# Patient Record
Sex: Female | Born: 1970
Health system: Southern US, Community
[De-identification: ages and names within clinical notes are randomized; demographics above are authoritative.]

## PROBLEM LIST (undated history)

## (undated) DIAGNOSIS — I10 Essential (primary) hypertension: Secondary | ICD-10-CM

## (undated) HISTORY — DX: Essential (primary) hypertension: I10

---

## 1993-10-06 HISTORY — PX: KNEE SURGERY: SHX244

## 1998-09-01 ENCOUNTER — Other Ambulatory Visit: Admission: RE | Admit: 1998-09-01 | Discharge: 1998-09-01 | Payer: Self-pay | Admitting: Plastic Surgery

## 2016-05-08 HISTORY — PX: ABLATION: SHX5711

## 2016-10-31 DIAGNOSIS — Z6831 Body mass index (BMI) 31.0-31.9, adult: Secondary | ICD-10-CM | POA: Diagnosis not present

## 2016-10-31 DIAGNOSIS — Z1389 Encounter for screening for other disorder: Secondary | ICD-10-CM | POA: Diagnosis not present

## 2016-10-31 DIAGNOSIS — Z0000 Encounter for general adult medical examination without abnormal findings: Secondary | ICD-10-CM | POA: Diagnosis not present

## 2016-11-02 DIAGNOSIS — N924 Excessive bleeding in the premenopausal period: Secondary | ICD-10-CM | POA: Diagnosis not present

## 2016-11-02 DIAGNOSIS — Z6832 Body mass index (BMI) 32.0-32.9, adult: Secondary | ICD-10-CM | POA: Diagnosis not present

## 2016-11-02 DIAGNOSIS — Z01419 Encounter for gynecological examination (general) (routine) without abnormal findings: Secondary | ICD-10-CM | POA: Diagnosis not present

## 2016-11-02 DIAGNOSIS — Z1231 Encounter for screening mammogram for malignant neoplasm of breast: Secondary | ICD-10-CM | POA: Diagnosis not present

## 2016-11-02 DIAGNOSIS — N920 Excessive and frequent menstruation with regular cycle: Secondary | ICD-10-CM | POA: Diagnosis not present

## 2016-11-20 MED FILL — TRANEXAMIC ACID 650 MG TAB: 650 | 5 days supply | Qty: 30 | Fill #0

## 2017-05-22 ENCOUNTER — Telehealth: Payer: 59 | Admitting: Family

## 2017-05-22 DIAGNOSIS — R059 Cough, unspecified: Secondary | ICD-10-CM

## 2017-05-22 DIAGNOSIS — R05 Cough: Secondary | ICD-10-CM

## 2017-05-22 MED ORDER — PREDNISONE 10 MG (21) PO TBPK: ORAL_TABLET | ORAL | 0 refills | Status: DC

## 2017-05-22 MED ORDER — AZITHROMYCIN 250 MG PO TABS: ORAL_TABLET | ORAL | 0 refills | Status: DC

## 2017-05-22 NOTE — Progress Notes (Signed)
We are sorry that you are not feeling well.  Here is how we plan to help!  Based on your presentation I believe you most likely have A cough due to bacteria.  When patients have a fever and a productive cough with a change in color or increased sputum production, we are concerned about bacterial bronchitis.  If left untreated it can progress to pneumonia.  If your symptoms do not improve with your treatment plan it is important that you contact your provider.   I have prescribed Azithromyin 250 mg: two tablets now and then one tablet daily for 4 additonal days    In addition you may use A non-prescription cough medication called Robitussin DAC. Take 2 teaspoons every 8 hours or Delsym: take 2 teaspoons every 12 hours.  Sterapred 10 mg dosepak  From your responses in the eVisit questionnaire you describe inflammation in the upper respiratory tract which is causing a significant cough.  This is commonly called Bronchitis and has four common causes:    Allergies  Viral Infections  Acid Reflux  Bacterial Infection Allergies, viruses and acid reflux are treated by controlling symptoms or eliminating the cause. An example might be a cough caused by taking certain blood pressure medications. You stop the cough by changing the medication. Another example might be a cough caused by acid reflux. Controlling the reflux helps control the cough.  USE OF BRONCHODILATOR ("RESCUE") INHALERS: There is a risk from using your bronchodilator too frequently.  The risk is that over-reliance on a medication which only relaxes the muscles surrounding the breathing tubes can reduce the effectiveness of medications prescribed to reduce swelling and congestion of the tubes themselves.  Although you feel brief relief from the bronchodilator inhaler, your asthma may actually be worsening with the tubes becoming more swollen and filled with mucus.  This can delay other crucial treatments, such as oral steroid medications. If  you need to use a bronchodilator inhaler daily, several times per day, you should discuss this with your provider.  There are probably better treatments that could be used to keep your asthma under control.     HOME CARE . Only take medications as instructed by your medical team. . Complete the entire course of an antibiotic. . Drink plenty of fluids and get plenty of rest. . Avoid close contacts especially the very young and the elderly . Cover your mouth if you cough or cough into your sleeve. . Always remember to wash your hands . A steam or ultrasonic humidifier can help congestion.   GET HELP RIGHT AWAY IF: . You develop worsening fever. . You become short of breath . You cough up blood. . Your symptoms persist after you have completed your treatment plan MAKE SURE YOU   Understand these instructions.  Will watch your condition.  Will get help right away if you are not doing well or get worse.  Your e-visit answers were reviewed by a board certified advanced clinical practitioner to complete your personal care plan.  Depending on the condition, your plan could have included both over the counter or prescription medications. If there is a problem please reply  once you have received a response from your provider. Your safety is important to [place].  If you have drug allergies check your prescription carefully.    You can use MyChart to ask questions about today's visit, request a non-urgent call back, or ask for a work or school excuse for 24 hours related to this e-Visit.  If it has been greater than 24 hours you will need to follow up with your provider, or enter a new e-Visit to address those concerns. You will get an e-mail in the next two days asking about your experience.  I hope that your e-visit has been valuable and will speed your recovery. Thank you for using e-visits.

## 2017-10-02 DIAGNOSIS — H52223 Regular astigmatism, bilateral: Secondary | ICD-10-CM | POA: Diagnosis not present

## 2017-10-02 DIAGNOSIS — H524 Presbyopia: Secondary | ICD-10-CM | POA: Diagnosis not present

## 2017-10-02 DIAGNOSIS — H5203 Hypermetropia, bilateral: Secondary | ICD-10-CM | POA: Diagnosis not present

## 2017-11-13 DIAGNOSIS — Z01419 Encounter for gynecological examination (general) (routine) without abnormal findings: Secondary | ICD-10-CM | POA: Diagnosis not present

## 2017-11-13 DIAGNOSIS — Z1231 Encounter for screening mammogram for malignant neoplasm of breast: Secondary | ICD-10-CM | POA: Diagnosis not present

## 2017-11-13 DIAGNOSIS — Z6833 Body mass index (BMI) 33.0-33.9, adult: Secondary | ICD-10-CM | POA: Diagnosis not present

## 2017-12-10 DIAGNOSIS — N921 Excessive and frequent menstruation with irregular cycle: Secondary | ICD-10-CM | POA: Diagnosis not present

## 2017-12-10 DIAGNOSIS — D252 Subserosal leiomyoma of uterus: Secondary | ICD-10-CM | POA: Diagnosis not present

## 2017-12-10 DIAGNOSIS — D251 Intramural leiomyoma of uterus: Secondary | ICD-10-CM | POA: Diagnosis not present

## 2017-12-13 MED FILL — BLISOVI 24 FE TABLET: 1-20 | 28 days supply | Qty: 28 | Fill #0

## 2017-12-24 DIAGNOSIS — N920 Excessive and frequent menstruation with regular cycle: Secondary | ICD-10-CM | POA: Diagnosis not present

## 2017-12-24 MED FILL — OXYCODONE-ACETAMINOPHEN 5-3: 5-325 | 5 days supply | Qty: 15 | Fill #0

## 2017-12-24 MED FILL — miSOPROStol 200 MCG TABS: 200 | 1 days supply | Qty: 1 | Fill #0

## 2017-12-24 MED FILL — IBUPROFEN 800 MG TAB: 800 | 7 days supply | Qty: 20 | Fill #0

## 2017-12-27 DIAGNOSIS — N926 Irregular menstruation, unspecified: Secondary | ICD-10-CM | POA: Diagnosis not present

## 2017-12-27 DIAGNOSIS — Z3202 Encounter for pregnancy test, result negative: Secondary | ICD-10-CM | POA: Diagnosis not present

## 2018-02-18 DIAGNOSIS — D225 Melanocytic nevi of trunk: Secondary | ICD-10-CM | POA: Diagnosis not present

## 2018-02-18 DIAGNOSIS — Z23 Encounter for immunization: Secondary | ICD-10-CM | POA: Diagnosis not present

## 2018-02-18 DIAGNOSIS — L814 Other melanin hyperpigmentation: Secondary | ICD-10-CM | POA: Diagnosis not present

## 2018-02-18 DIAGNOSIS — Z808 Family history of malignant neoplasm of other organs or systems: Secondary | ICD-10-CM | POA: Diagnosis not present

## 2018-02-18 DIAGNOSIS — Z86018 Personal history of other benign neoplasm: Secondary | ICD-10-CM | POA: Diagnosis not present

## 2019-04-16 DIAGNOSIS — Z1231 Encounter for screening mammogram for malignant neoplasm of breast: Secondary | ICD-10-CM | POA: Diagnosis not present

## 2019-04-16 DIAGNOSIS — Z01419 Encounter for gynecological examination (general) (routine) without abnormal findings: Secondary | ICD-10-CM | POA: Diagnosis not present

## 2019-04-16 DIAGNOSIS — Z6834 Body mass index (BMI) 34.0-34.9, adult: Secondary | ICD-10-CM | POA: Diagnosis not present

## 2019-04-30 DIAGNOSIS — Z6833 Body mass index (BMI) 33.0-33.9, adult: Secondary | ICD-10-CM | POA: Diagnosis not present

## 2019-04-30 DIAGNOSIS — I10 Essential (primary) hypertension: Secondary | ICD-10-CM | POA: Diagnosis not present

## 2019-05-01 MED FILL — traZODone HCL 50 MG TABS: 50 | 30 days supply | Qty: 30 | Fill #0

## 2019-05-26 MED FILL — LOSARTAN POTASSIUM 50 MG TA: 50 | 30 days supply | Qty: 30 | Fill #0

## 2019-05-26 MED FILL — traZODone HCL 50 MG TABS: 50 | 30 days supply | Qty: 30 | Fill #1

## 2019-05-30 DIAGNOSIS — I10 Essential (primary) hypertension: Secondary | ICD-10-CM | POA: Diagnosis not present

## 2019-05-30 DIAGNOSIS — Z7689 Persons encountering health services in other specified circumstances: Secondary | ICD-10-CM | POA: Diagnosis not present

## 2019-05-30 DIAGNOSIS — R079 Chest pain, unspecified: Secondary | ICD-10-CM | POA: Diagnosis not present

## 2019-06-02 DIAGNOSIS — R079 Chest pain, unspecified: Secondary | ICD-10-CM | POA: Diagnosis not present

## 2019-06-02 DIAGNOSIS — I10 Essential (primary) hypertension: Secondary | ICD-10-CM | POA: Diagnosis not present

## 2019-06-02 DIAGNOSIS — Z6832 Body mass index (BMI) 32.0-32.9, adult: Secondary | ICD-10-CM | POA: Diagnosis not present

## 2019-06-02 MED FILL — traZODone HCL 100 MG TABS: 100 | 30 days supply | Qty: 30 | Fill #0

## 2019-06-24 MED FILL — LOSARTAN POTASSIUM 50 MG TA: 50 | 30 days supply | Qty: 30 | Fill #1

## 2019-06-26 ENCOUNTER — Ambulatory Visit: Payer: 59 | Admitting: Cardiovascular Disease

## 2019-06-26 MED FILL — traZODone HCL 100 MG TABS: 100 | 30 days supply | Qty: 30 | Fill #1

## 2019-07-17 ENCOUNTER — Ambulatory Visit (INDEPENDENT_AMBULATORY_CARE_PROVIDER_SITE_OTHER): Payer: 59 | Admitting: Cardiovascular Disease

## 2019-07-17 ENCOUNTER — Encounter: Payer: Self-pay | Admitting: Cardiovascular Disease

## 2019-07-17 ENCOUNTER — Other Ambulatory Visit: Payer: Self-pay

## 2019-07-17 VITALS — BP 110/76 | HR 80 | Temp 97.0°F | Ht 67.0 in | Wt 209.0 lb

## 2019-07-17 DIAGNOSIS — R079 Chest pain, unspecified: Secondary | ICD-10-CM | POA: Diagnosis not present

## 2019-07-17 DIAGNOSIS — I10 Essential (primary) hypertension: Secondary | ICD-10-CM | POA: Diagnosis not present

## 2019-07-17 NOTE — Progress Notes (Signed)
CARDIOLOGY CONSULT NOTE  Patient ID: Karen Parker MRN: [id number] DOB/AGE: January 25, 1971 49 y.o.  Admit date: (Not on file) Primary Physician: Lanelle Bal PA-C  Reason for Consultation: Chest pain  HPI: Karen Parker is a 49 y.o. female who is being seen today for the evaluation of chest pain at the request of Lanelle Bal PA-C.  Her son, Jeneen Rinks, is also my patient.  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  I reviewed records from her PCP.  Labs on 05/30/2019 showed total cholesterol 161, triglycerides 187, HDL 45, LDL 84.  Past medical history includes hypertension.  An ECG was reportedly performed at her PCPs office which I will have to request.  She had been having blood pressures as high as 182/87.  She was then started on losartan.  I reviewed her blood pressure log.  She has not been having any chest pains over the past month.  She denies shortness of breath, palpitations, dizziness, leg swelling, orthopnea, and paroxysmal nocturnal dyspnea.  Family history: Father has a history of aortic dissection.   No Known Allergies  Current Outpatient Medications  Medication Sig Dispense Refill  . cetirizine (ZYRTEC) 10 MG tablet Take 10 mg by mouth daily.    . Cholecalciferol (VITAMIN D3) 50 MCG (2000 UT) capsule Take 2,000 Units by mouth daily.    . Melatonin 200 MCG TABS Take 1 tablet by mouth daily.    Marland Kitchen omeprazole (PRILOSEC) 20 MG capsule Take 20 mg by mouth daily.    Marland Kitchen zinc gluconate 50 MG tablet Take 50 mg by mouth daily.    Marland Kitchen losartan (COZAAR) 50 MG tablet Take 50 mg by mouth daily.    . traZODone (DESYREL) 100 MG tablet Take 100 mg by mouth at bedtime.     No current facility-administered medications for this visit.    Past Medical History:  Diagnosis Date  . Hypertension     Past Surgical History:  Procedure Laterality Date  . KNEE SURGERY Right 10/1993    Social History   Socioeconomic History  . Marital  status: Married    Spouse name: Not on file  . Number of children: Not on file  . Years of education: Not on file  . Highest education level: Not on file  Occupational History  . Not on file  Tobacco Use  . Smoking status: Never Smoker  . Smokeless tobacco: Never Used  Substance and Sexual Activity  . Alcohol use: Not on file  . Drug use: Not on file  . Sexual activity: Not on file  Other Topics Concern  . Not on file  Social History Narrative  . Not on file   Social Determinants of Health   Financial Resource Strain:   . Difficulty of Paying Living Expenses:   Food Insecurity:   . Worried About Charity fundraiser in the Last Year:   . Arboriculturist in the Last Year:   Transportation Needs:   . Film/video editor (Medical):   Marland Kitchen Lack of Transportation (Non-Medical):   Physical Activity:   . Days of Exercise per Week:   . Minutes of Exercise per Session:   Stress:   . Feeling of Stress :   Social Connections:   . Frequency of Communication with Friends and Family:   . Frequency of Social Gatherings with Friends and Family:   . Attends Religious Services:   . Active Member of Clubs or Organizations:   .  Attends Archivist Meetings:   Marland Kitchen Marital Status:   Intimate Partner Violence:   . Fear of Current or Ex-Partner:   . Emotionally Abused:   Marland Kitchen Physically Abused:   . Sexually Abused:       Current Meds  Medication Sig  . cetirizine (ZYRTEC) 10 MG tablet Take 10 mg by mouth daily.  . Cholecalciferol (VITAMIN D3) 50 MCG (2000 UT) capsule Take 2,000 Units by mouth daily.  . Melatonin 200 MCG TABS Take 1 tablet by mouth daily.  Marland Kitchen omeprazole (PRILOSEC) 20 MG capsule Take 20 mg by mouth daily.  Marland Kitchen zinc gluconate 50 MG tablet Take 50 mg by mouth daily.      Review of systems complete and found to be negative unless listed above in HPI   Barbarann Ehlers, RN was present throughout the entirety of the encounter.  Physical exam Blood pressure 110/76,  pulse 80, temperature (!) 97 F (36.1 C), height 5\' 7"  (1.702 m), SpO2 98 %. General: NAD Neck: No JVD, no thyromegaly or thyroid nodule.  Lungs: Clear to auscultation bilaterally with normal respiratory effort. CV: Nondisplaced PMI. Regular rate and rhythm, normal S1/S2, no S3/S4, no murmur.  No peripheral edema.  No carotid bruit.    Abdomen: Soft, nontender, no distention.  Skin: Intact without lesions or rashes.  Neurologic: Alert and oriented x 3.  Psych: Normal affect. Extremities: No clubbing or cyanosis.  HEENT: Normal.   ECG: Most recent ECG reviewed.   Labs: No results found for: K, BUN, CREATININE, ALT, TSH, HGB   Lipids: No results found for: LDLCALC, LDLDIRECT, CHOL, TRIG, HDL      ASSESSMENT AND PLAN:   1.  Chest pain: No symptom recurrence.  It was likely due to severely elevated blood pressures.  Hypertension is under good control now.  2.  Hypertension: Blood pressure is normal.  No change to therapy.    Disposition: Follow up as needed  Signed: Kate Sable, M.D., F.A.C.C.  07/17/2019, 2:49 PM

## 2019-07-17 NOTE — Patient Instructions (Signed)
Medication Instructions:  Your physician recommends that you continue on your current medications as directed. Please refer to the Current Medication list given to you today.  *If you need a refill on your cardiac medications before your next appointment, please call your pharmacy*   Lab Work: None  If you have labs (blood work) drawn today and your tests are completely normal, you will receive your results only by: Marland Kitchen MyChart Message (if you have MyChart) OR . A paper copy in the mail If you have any lab test that is abnormal or we need to change your treatment, we will call you to review the results.   Testing/Procedures: None    Follow-Up: At [facility], you and your health needs are our priority.  As part of our continuing mission to provide you with exceptional heart care, we have created designated Provider Care Teams.  These Care Teams include your primary Cardiologist (physician) and Advanced Practice Providers (APPs -  Physician Assistants and Nurse Practitioners) who all work together to provide you with the care you need, when you need it.  We recommend signing up for the patient portal called "MyChart".  Sign up information is provided on this After Visit Summary.  MyChart is used to connect with patients for Virtual Visits (Telemedicine).  Patients are able to view lab/test results, encounter notes, upcoming appointments, etc.  Non-urgent messages can be sent to your provider as well.   To learn more about what you can do with MyChart, go to [email or web address].    Your next appointment:  As needed with Dr.Koneswaran      Thank you for choosing [facility] !

## 2019-07-28 MED FILL — LOSARTAN POTASSIUM 50 MG TA: 50 | 30 days supply | Qty: 30 | Fill #0

## 2019-07-28 MED FILL — traZODone HCL 100 MG TABS: 100 | 30 days supply | Qty: 30 | Fill #2

## 2019-08-26 MED FILL — LOSARTAN POTASSIUM 50 MG TA: 50 | 30 days supply | Qty: 30 | Fill #1

## 2019-08-26 MED FILL — traZODone HCL 100 MG TABS: 100 | 30 days supply | Qty: 30 | Fill #3

## 2019-09-02 ENCOUNTER — Other Ambulatory Visit (HOSPITAL_COMMUNITY): Payer: Self-pay | Admitting: Family Medicine

## 2019-09-02 DIAGNOSIS — G4700 Insomnia, unspecified: Secondary | ICD-10-CM | POA: Diagnosis not present

## 2019-09-02 DIAGNOSIS — Z6831 Body mass index (BMI) 31.0-31.9, adult: Secondary | ICD-10-CM | POA: Diagnosis not present

## 2019-09-02 DIAGNOSIS — I10 Essential (primary) hypertension: Secondary | ICD-10-CM | POA: Diagnosis not present

## 2019-09-24 MED FILL — LOSARTAN POTASSIUM 50 MG TA: 50 | 30 days supply | Qty: 30 | Fill #0

## 2019-09-24 MED FILL — traZODone HCL 100 MG TABS: 100 | 30 days supply | Qty: 30 | Fill #0

## 2019-10-16 DIAGNOSIS — L570 Actinic keratosis: Secondary | ICD-10-CM | POA: Diagnosis not present

## 2019-10-16 DIAGNOSIS — I781 Nevus, non-neoplastic: Secondary | ICD-10-CM | POA: Diagnosis not present

## 2019-10-16 DIAGNOSIS — L814 Other melanin hyperpigmentation: Secondary | ICD-10-CM | POA: Diagnosis not present

## 2019-10-27 MED FILL — LOSARTAN POTASSIUM 50 MG TA: 50 | 30 days supply | Qty: 30 | Fill #1

## 2019-10-27 MED FILL — traZODone HCL 100 MG TABS: 100 | 30 days supply | Qty: 30 | Fill #1

## 2019-11-22 MED FILL — traZODone HCL 100 MG TABS: 100 | 30 days supply | Qty: 30 | Fill #2

## 2019-11-22 MED FILL — LOSARTAN POTASSIUM 50 MG TA: 50 | 30 days supply | Qty: 30 | Fill #2

## 2019-12-22 MED FILL — LOSARTAN POTASSIUM 50 MG TA: 50 | 30 days supply | Qty: 30 | Fill #3

## 2019-12-22 MED FILL — traZODone HCL 100 MG TABS: 100 | 30 days supply | Qty: 30 | Fill #3

## 2020-01-21 MED FILL — LOSARTAN POTASSIUM 50 MG TA: 50 | 30 days supply | Qty: 30 | Fill #4

## 2020-01-21 MED FILL — traZODone HCL 100 MG TABS: 100 | 30 days supply | Qty: 30 | Fill #4

## 2020-02-09 DIAGNOSIS — D1801 Hemangioma of skin and subcutaneous tissue: Secondary | ICD-10-CM | POA: Diagnosis not present

## 2020-02-09 DIAGNOSIS — D2272 Melanocytic nevi of left lower limb, including hip: Secondary | ICD-10-CM | POA: Diagnosis not present

## 2020-02-09 DIAGNOSIS — I781 Nevus, non-neoplastic: Secondary | ICD-10-CM | POA: Diagnosis not present

## 2020-02-09 DIAGNOSIS — L814 Other melanin hyperpigmentation: Secondary | ICD-10-CM | POA: Diagnosis not present

## 2020-02-09 DIAGNOSIS — L570 Actinic keratosis: Secondary | ICD-10-CM | POA: Diagnosis not present

## 2020-02-09 DIAGNOSIS — D485 Neoplasm of uncertain behavior of skin: Secondary | ICD-10-CM | POA: Diagnosis not present

## 2020-02-17 MED FILL — LOSARTAN POTASSIUM 50 MG TA: 50 | 30 days supply | Qty: 30 | Fill #5

## 2020-02-17 MED FILL — traZODone HCL 100 MG TABS: 100 | 30 days supply | Qty: 30 | Fill #5

## 2020-03-04 ENCOUNTER — Other Ambulatory Visit (HOSPITAL_COMMUNITY): Payer: Self-pay | Admitting: Family Medicine

## 2020-03-04 DIAGNOSIS — G4700 Insomnia, unspecified: Secondary | ICD-10-CM | POA: Diagnosis not present

## 2020-03-04 DIAGNOSIS — Z6831 Body mass index (BMI) 31.0-31.9, adult: Secondary | ICD-10-CM | POA: Diagnosis not present

## 2020-03-04 DIAGNOSIS — I10 Essential (primary) hypertension: Secondary | ICD-10-CM | POA: Diagnosis not present

## 2020-03-18 MED FILL — traZODone HCL 100 MG TABS: 100 | 30 days supply | Qty: 30 | Fill #6

## 2020-03-18 MED FILL — LOSARTAN POTASSIUM 50 MG TA: 50 | 30 days supply | Qty: 30 | Fill #6

## 2020-04-19 MED FILL — traZODone HCL 100 MG TABS: 100 | 90 days supply | Qty: 90 | Fill #0

## 2020-04-19 MED FILL — LOSARTAN POTASSIUM 50 MG TA: 50 | 90 days supply | Qty: 90 | Fill #0

## 2020-05-06 DIAGNOSIS — U071 COVID-19: Secondary | ICD-10-CM | POA: Diagnosis not present

## 2020-06-15 DIAGNOSIS — Z01419 Encounter for gynecological examination (general) (routine) without abnormal findings: Secondary | ICD-10-CM | POA: Diagnosis not present

## 2020-06-15 DIAGNOSIS — Z6834 Body mass index (BMI) 34.0-34.9, adult: Secondary | ICD-10-CM | POA: Diagnosis not present

## 2020-06-15 DIAGNOSIS — Z1231 Encounter for screening mammogram for malignant neoplasm of breast: Secondary | ICD-10-CM | POA: Diagnosis not present

## 2020-07-05 ENCOUNTER — Telehealth: Payer: 59 | Admitting: Emergency Medicine

## 2020-07-05 DIAGNOSIS — J019 Acute sinusitis, unspecified: Secondary | ICD-10-CM | POA: Diagnosis not present

## 2020-07-05 DIAGNOSIS — B9689 Other specified bacterial agents as the cause of diseases classified elsewhere: Secondary | ICD-10-CM

## 2020-07-05 MED ORDER — PREDNISONE 50 MG PO TABS: 50.0000 mg | ORAL_TABLET | Freq: Every day | ORAL | 0 refills | Status: AC

## 2020-07-05 MED ORDER — AMOXICILLIN-POT CLAVULANATE 875-125 MG PO TABS: 1.0000 | ORAL_TABLET | Freq: Two times a day (BID) | ORAL | 0 refills | Status: DC

## 2020-07-05 NOTE — Progress Notes (Signed)
We are sorry that you are not feeling well.  Here is how we plan to help!  Based on what you have shared with me it looks like you have sinusitis.  Sinusitis is inflammation and infection in the sinus cavities of the head.  Based on your presentation I believe you most likely have Acute Bacterial Sinusitis.  This is an infection caused by bacteria and is treated with antibiotics. I have prescribed Augmentin 875mg /125mg  one tablet twice daily with food, for 7 days. You are also being prescribed prednisone 50mg  once daily with food for 5 days to help with pain and inflammation in your sinuses. You may use an oral decongestant such as Mucinex D or if you have glaucoma or high blood pressure use plain Mucinex. Saline nasal spray help and can safely be used as often as needed for congestion.  If you develop worsening sinus pain, fever or notice severe headache and vision changes, or if symptoms are not better after completion of antibiotic, please schedule an appointment with a health care provider.    Sinus infections are not as easily transmitted as other respiratory infection, however we still recommend that you avoid close contact with loved ones, especially the very young and elderly.  Remember to wash your hands thoroughly throughout the day as this is the number one way to prevent the spread of infection!  Home Care:  Only take medications as instructed by your medical team.  Complete the entire course of an antibiotic.  Do not take these medications with alcohol.  A steam or ultrasonic humidifier can help congestion.  You can place a towel over your head and breathe in the steam from hot water coming from a faucet.  Avoid close contacts especially the very young and the elderly.  Cover your mouth when you cough or sneeze.  Always remember to wash your hands.  Get Help Right Away If:  You develop worsening fever or sinus pain.  You develop a severe head ache or visual changes.  Your  symptoms persist after you have completed your treatment plan.  Make sure you  Understand these instructions.  Will watch your condition.  Will get help right away if you are not doing well or get worse.  Your e-visit answers were reviewed by a board certified advanced clinical practitioner to complete your personal care plan.  Depending on the condition, your plan could have included both over the counter or prescription medications.  If there is a problem please reply  once you have received a response from your provider.  Your safety is important to [place].  If you have drug allergies check your prescription carefully.    You can use MyChart to ask questions about today's visit, request a non-urgent call back, or ask for a work or school excuse for 24 hours related to this e-Visit. If it has been greater than 24 hours you will need to follow up with your provider, or enter a new e-Visit to address those concerns.  You will get an e-mail in the next two days asking about your experience.  I hope that your e-visit has been valuable and will speed your recovery. Thank you for using e-visits.    Approximately 5 minutes was spent documenting and reviewing patient's chart.

## 2020-08-07 ENCOUNTER — Other Ambulatory Visit (HOSPITAL_COMMUNITY): Payer: Self-pay

## 2020-08-16 ENCOUNTER — Other Ambulatory Visit (HOSPITAL_COMMUNITY): Payer: Self-pay

## 2020-08-16 MED FILL — Losartan Potassium Tab 50 MG: ORAL | 90 days supply | Qty: 90 | Fill #0 | Status: AC

## 2020-08-17 ENCOUNTER — Other Ambulatory Visit (HOSPITAL_COMMUNITY): Payer: Self-pay

## 2020-08-20 ENCOUNTER — Other Ambulatory Visit (HOSPITAL_COMMUNITY): Payer: Self-pay

## 2020-08-26 DIAGNOSIS — Z1329 Encounter for screening for other suspected endocrine disorder: Secondary | ICD-10-CM | POA: Diagnosis not present

## 2020-08-26 DIAGNOSIS — R739 Hyperglycemia, unspecified: Secondary | ICD-10-CM | POA: Diagnosis not present

## 2020-08-26 DIAGNOSIS — K219 Gastro-esophageal reflux disease without esophagitis: Secondary | ICD-10-CM | POA: Diagnosis not present

## 2020-08-26 DIAGNOSIS — Z13220 Encounter for screening for lipoid disorders: Secondary | ICD-10-CM | POA: Diagnosis not present

## 2020-08-26 DIAGNOSIS — I10 Essential (primary) hypertension: Secondary | ICD-10-CM | POA: Diagnosis not present

## 2020-08-31 DIAGNOSIS — J309 Allergic rhinitis, unspecified: Secondary | ICD-10-CM | POA: Diagnosis not present

## 2020-08-31 DIAGNOSIS — Z6834 Body mass index (BMI) 34.0-34.9, adult: Secondary | ICD-10-CM | POA: Diagnosis not present

## 2020-08-31 DIAGNOSIS — E7849 Other hyperlipidemia: Secondary | ICD-10-CM | POA: Diagnosis not present

## 2020-08-31 DIAGNOSIS — R739 Hyperglycemia, unspecified: Secondary | ICD-10-CM | POA: Diagnosis not present

## 2020-08-31 DIAGNOSIS — I10 Essential (primary) hypertension: Secondary | ICD-10-CM | POA: Diagnosis not present

## 2020-09-17 ENCOUNTER — Other Ambulatory Visit (HOSPITAL_COMMUNITY): Payer: Self-pay

## 2020-09-17 MED ORDER — OZEMPIC (1 MG/DOSE) 4 MG/3ML ~~LOC~~ SOPN
1.0000 mg | PEN_INJECTOR | SUBCUTANEOUS | 0 refills | Status: DC
  Filled 2021-02-17: qty 3, 28d supply, fill #0

## 2020-09-17 MED ORDER — ONDANSETRON 4 MG PO TBDP
4.0000 mg | ORAL_TABLET | Freq: Four times a day (QID) | ORAL | 0 refills | Status: DC | PRN
  Filled 2020-09-17: qty 30, 8d supply, fill #0

## 2020-09-17 MED ORDER — OZEMPIC (0.25 OR 0.5 MG/DOSE) 2 MG/1.5ML ~~LOC~~ SOPN
PEN_INJECTOR | SUBCUTANEOUS | 1 refills | Status: DC
Start: 2020-09-17 — End: 2021-06-16
  Filled 2020-09-17: qty 1.5, 42d supply, fill #0
  Filled 2020-10-30: qty 1.5, 28d supply, fill #1

## 2020-09-21 ENCOUNTER — Other Ambulatory Visit (HOSPITAL_COMMUNITY): Payer: Self-pay

## 2020-09-22 ENCOUNTER — Other Ambulatory Visit (HOSPITAL_COMMUNITY): Payer: Self-pay

## 2020-10-12 MED FILL — Trazodone HCl Tab 100 MG: ORAL | 90 days supply | Qty: 90 | Fill #0 | Status: AC

## 2020-10-13 ENCOUNTER — Other Ambulatory Visit (HOSPITAL_COMMUNITY): Payer: Self-pay

## 2020-10-14 ENCOUNTER — Other Ambulatory Visit (HOSPITAL_COMMUNITY): Payer: Self-pay

## 2020-11-01 ENCOUNTER — Other Ambulatory Visit (HOSPITAL_COMMUNITY): Payer: Self-pay

## 2020-11-11 MED FILL — Losartan Potassium Tab 50 MG: ORAL | 90 days supply | Qty: 90 | Fill #1 | Status: AC

## 2020-11-12 ENCOUNTER — Other Ambulatory Visit (HOSPITAL_COMMUNITY): Payer: Self-pay

## 2020-12-01 ENCOUNTER — Other Ambulatory Visit (HOSPITAL_COMMUNITY): Payer: Self-pay

## 2020-12-01 MED ORDER — OZEMPIC (1 MG/DOSE) 4 MG/3ML ~~LOC~~ SOPN
1.0000 mg | PEN_INJECTOR | SUBCUTANEOUS | 2 refills | Status: DC
  Filled 2020-12-01: qty 3, 28d supply, fill #0
  Filled 2020-12-26: qty 3, 28d supply, fill #1
  Filled 2021-01-23: qty 3, 28d supply, fill #2

## 2020-12-27 ENCOUNTER — Other Ambulatory Visit (HOSPITAL_COMMUNITY): Payer: Self-pay

## 2021-01-10 MED FILL — Trazodone HCl Tab 100 MG: ORAL | 90 days supply | Qty: 90 | Fill #1 | Status: AC

## 2021-01-11 ENCOUNTER — Other Ambulatory Visit (HOSPITAL_COMMUNITY): Payer: Self-pay

## 2021-01-24 ENCOUNTER — Other Ambulatory Visit (HOSPITAL_COMMUNITY): Payer: Self-pay

## 2021-02-10 ENCOUNTER — Other Ambulatory Visit (HOSPITAL_COMMUNITY): Payer: Self-pay

## 2021-02-10 MED FILL — Losartan Potassium Tab 50 MG: ORAL | 90 days supply | Qty: 90 | Fill #2 | Status: CN

## 2021-02-15 ENCOUNTER — Other Ambulatory Visit (HOSPITAL_COMMUNITY): Payer: Self-pay

## 2021-02-15 MED FILL — Losartan Potassium Tab 50 MG: ORAL | 60 days supply | Qty: 60 | Fill #2 | Status: AC

## 2021-02-16 DIAGNOSIS — D2272 Melanocytic nevi of left lower limb, including hip: Secondary | ICD-10-CM | POA: Diagnosis not present

## 2021-02-16 DIAGNOSIS — D239 Other benign neoplasm of skin, unspecified: Secondary | ICD-10-CM | POA: Diagnosis not present

## 2021-02-16 DIAGNOSIS — I781 Nevus, non-neoplastic: Secondary | ICD-10-CM | POA: Diagnosis not present

## 2021-02-16 DIAGNOSIS — L570 Actinic keratosis: Secondary | ICD-10-CM | POA: Diagnosis not present

## 2021-02-16 DIAGNOSIS — D485 Neoplasm of uncertain behavior of skin: Secondary | ICD-10-CM | POA: Diagnosis not present

## 2021-02-17 ENCOUNTER — Other Ambulatory Visit (HOSPITAL_COMMUNITY): Payer: Self-pay

## 2021-02-18 ENCOUNTER — Other Ambulatory Visit (HOSPITAL_COMMUNITY): Payer: Self-pay

## 2021-02-22 ENCOUNTER — Other Ambulatory Visit (HOSPITAL_COMMUNITY): Payer: Self-pay

## 2021-02-24 ENCOUNTER — Other Ambulatory Visit (HOSPITAL_COMMUNITY): Payer: Self-pay

## 2021-02-26 ENCOUNTER — Other Ambulatory Visit (HOSPITAL_COMMUNITY): Payer: Self-pay

## 2021-03-01 ENCOUNTER — Other Ambulatory Visit (HOSPITAL_COMMUNITY): Payer: Self-pay

## 2021-03-03 DIAGNOSIS — D485 Neoplasm of uncertain behavior of skin: Secondary | ICD-10-CM | POA: Diagnosis not present

## 2021-03-03 DIAGNOSIS — L988 Other specified disorders of the skin and subcutaneous tissue: Secondary | ICD-10-CM | POA: Diagnosis not present

## 2021-03-30 ENCOUNTER — Other Ambulatory Visit (HOSPITAL_COMMUNITY): Payer: Self-pay

## 2021-04-01 ENCOUNTER — Other Ambulatory Visit (HOSPITAL_COMMUNITY): Payer: Self-pay

## 2021-04-01 MED ORDER — TRAZODONE HCL 100 MG PO TABS
100.0000 mg | ORAL_TABLET | Freq: Every evening | ORAL | 1 refills | Status: DC
  Filled 2021-04-01: qty 90, 90d supply, fill #0
  Filled 2021-07-04: qty 90, 90d supply, fill #1

## 2021-04-01 MED ORDER — LOSARTAN POTASSIUM 50 MG PO TABS
50.0000 mg | ORAL_TABLET | Freq: Every day | ORAL | 1 refills | Status: DC
  Filled 2021-04-01: qty 90, 90d supply, fill #0
  Filled 2021-07-04: qty 90, 90d supply, fill #1

## 2021-04-04 ENCOUNTER — Other Ambulatory Visit (HOSPITAL_COMMUNITY): Payer: Self-pay

## 2021-04-07 ENCOUNTER — Other Ambulatory Visit (HOSPITAL_COMMUNITY): Payer: Self-pay

## 2021-04-08 ENCOUNTER — Other Ambulatory Visit (HOSPITAL_COMMUNITY): Payer: Self-pay

## 2021-04-11 ENCOUNTER — Other Ambulatory Visit (HOSPITAL_COMMUNITY): Payer: Self-pay

## 2021-04-19 ENCOUNTER — Other Ambulatory Visit (HOSPITAL_COMMUNITY): Payer: Self-pay

## 2021-04-21 ENCOUNTER — Other Ambulatory Visit (HOSPITAL_COMMUNITY): Payer: Self-pay

## 2021-05-09 DIAGNOSIS — R5382 Chronic fatigue, unspecified: Secondary | ICD-10-CM | POA: Diagnosis not present

## 2021-05-09 DIAGNOSIS — E663 Overweight: Secondary | ICD-10-CM | POA: Diagnosis not present

## 2021-05-09 DIAGNOSIS — N926 Irregular menstruation, unspecified: Secondary | ICD-10-CM | POA: Diagnosis not present

## 2021-05-09 DIAGNOSIS — L658 Other specified nonscarring hair loss: Secondary | ICD-10-CM | POA: Diagnosis not present

## 2021-05-09 DIAGNOSIS — E6609 Other obesity due to excess calories: Secondary | ICD-10-CM | POA: Diagnosis not present

## 2021-05-11 ENCOUNTER — Other Ambulatory Visit (HOSPITAL_COMMUNITY): Payer: Self-pay

## 2021-05-11 MED ORDER — OZEMPIC (0.25 OR 0.5 MG/DOSE) 2 MG/1.5ML ~~LOC~~ SOPN
0.5000 mg | PEN_INJECTOR | SUBCUTANEOUS | 0 refills | Status: DC
Start: 2021-05-11 — End: 2021-06-16
  Filled 2021-05-11: qty 1.5, 28d supply, fill #0

## 2021-05-23 ENCOUNTER — Other Ambulatory Visit (HOSPITAL_COMMUNITY): Payer: Self-pay

## 2021-05-23 MED ORDER — TESTOSTERONE 20.25 MG/ACT (1.62%) TD GEL
1.0000 | Freq: Every morning | TRANSDERMAL | 1 refills | Status: DC
  Filled 2021-05-23: qty 75, 30d supply, fill #0
  Filled 2021-05-31 – 2021-06-01 (×2): qty 75, 60d supply, fill #0
  Filled 2021-08-09: qty 75, 60d supply, fill #1

## 2021-05-23 MED ORDER — OZEMPIC (1 MG/DOSE) 4 MG/3ML ~~LOC~~ SOPN
1.0000 mg | PEN_INJECTOR | SUBCUTANEOUS | 2 refills | Status: DC
  Filled 2021-05-23: qty 3, 28d supply, fill #0
  Filled 2021-06-26: qty 3, 28d supply, fill #1
  Filled 2021-07-28: qty 3, 28d supply, fill #2

## 2021-05-23 MED ORDER — CHOLECALCIFEROL 1.25 MG (50000 UT) PO CAPS
50000.0000 [IU] | ORAL_CAPSULE | ORAL | 1 refills | Status: DC
  Filled 2021-05-23 (×2): qty 12, 84d supply, fill #0
  Filled 2021-08-09: qty 12, 84d supply, fill #1

## 2021-05-31 ENCOUNTER — Other Ambulatory Visit (HOSPITAL_COMMUNITY): Payer: Self-pay

## 2021-06-01 ENCOUNTER — Other Ambulatory Visit (HOSPITAL_COMMUNITY): Payer: Self-pay

## 2021-06-16 ENCOUNTER — Telehealth: Payer: 59 | Admitting: Physician Assistant

## 2021-06-16 DIAGNOSIS — J019 Acute sinusitis, unspecified: Secondary | ICD-10-CM | POA: Diagnosis not present

## 2021-06-16 DIAGNOSIS — B9689 Other specified bacterial agents as the cause of diseases classified elsewhere: Secondary | ICD-10-CM

## 2021-06-16 MED ORDER — AMOXICILLIN-POT CLAVULANATE 875-125 MG PO TABS: 1.0000 | ORAL_TABLET | Freq: Two times a day (BID) | ORAL | 0 refills | Status: DC

## 2021-06-16 NOTE — Progress Notes (Signed)
I have spent 5 minutes in review of e-visit questionnaire, review and updating patient chart, medical decision making and response to patient.   Amarian Botero Cody Geddy [place], PA-C    

## 2021-06-16 NOTE — Progress Notes (Signed)

## 2021-06-27 ENCOUNTER — Other Ambulatory Visit (HOSPITAL_COMMUNITY): Payer: Self-pay

## 2021-07-01 DIAGNOSIS — Z01419 Encounter for gynecological examination (general) (routine) without abnormal findings: Secondary | ICD-10-CM | POA: Diagnosis not present

## 2021-07-01 DIAGNOSIS — Z6832 Body mass index (BMI) 32.0-32.9, adult: Secondary | ICD-10-CM | POA: Diagnosis not present

## 2021-07-01 DIAGNOSIS — Z1231 Encounter for screening mammogram for malignant neoplasm of breast: Secondary | ICD-10-CM | POA: Diagnosis not present

## 2021-07-04 ENCOUNTER — Other Ambulatory Visit: Payer: Self-pay | Admitting: Obstetrics and Gynecology

## 2021-07-04 ENCOUNTER — Other Ambulatory Visit (HOSPITAL_COMMUNITY): Payer: Self-pay

## 2021-07-04 DIAGNOSIS — R928 Other abnormal and inconclusive findings on diagnostic imaging of breast: Secondary | ICD-10-CM

## 2021-07-08 ENCOUNTER — Ambulatory Visit: Payer: 59

## 2021-07-08 ENCOUNTER — Other Ambulatory Visit: Payer: Self-pay

## 2021-07-08 ENCOUNTER — Ambulatory Visit
Admission: EM | Admit: 2021-07-08 | Discharge: 2021-07-08 | Disposition: A | Discharge status: Home / Self Care | Payer: 59 | Attending: Urgent Care | Admitting: Urgent Care

## 2021-07-08 DIAGNOSIS — R052 Subacute cough: Secondary | ICD-10-CM | POA: Diagnosis not present

## 2021-07-08 DIAGNOSIS — J00 Acute nasopharyngitis [common cold]: Secondary | ICD-10-CM

## 2021-07-08 DIAGNOSIS — J3489 Other specified disorders of nose and nasal sinuses: Secondary | ICD-10-CM | POA: Diagnosis not present

## 2021-07-08 DIAGNOSIS — Z8709 Personal history of other diseases of the respiratory system: Secondary | ICD-10-CM | POA: Diagnosis not present

## 2021-07-08 MED ORDER — PROMETHAZINE-DM 6.25-15 MG/5ML PO SYRP: 5.0000 mL | ORAL_SOLUTION | Freq: Four times a day (QID) | ORAL | 0 refills | Status: DC | PRN

## 2021-07-08 MED ORDER — PREDNISONE 50 MG PO TABS: 50.0000 mg | ORAL_TABLET | Freq: Every day | ORAL | 0 refills | Status: DC

## 2021-07-08 MED ORDER — BENZONATATE 100 MG PO CAPS
100.0000 mg | ORAL_CAPSULE | Freq: Three times a day (TID) | ORAL | 0 refills | Status: DC | PRN
Start: 2021-07-08 — End: 2024-02-01

## 2021-07-08 NOTE — ED Provider Notes (Signed)
?[facility] ? ? ?MRN: [id number] DOB: 11-Jun-1970 ? ?Subjective:  ? ?Karen Parker is a 51 y.o. female presenting for 4 week history of persistent sinus congestion, maxillary sinus pressure/pain, persistent coughing. No fever, ear pain, chest pain, shob, wheezing. No history of asthma. No smoking. Underwent a course of Augmentin from 06/16/2021, had relief while on the antibiotic. Restarted Zyrtec and Mucinex this week.  ? ?No current facility-administered medications for this encounter. ? ?Current Outpatient Medications:  ?  amoxicillin-clavulanate (AUGMENTIN) 875-125 MG tablet, Take 1 tablet by mouth 2 (two) times daily., Disp: 14 tablet, Rfl: 0 ?  losartan (COZAAR) 50 MG tablet, Take 1 tablet (50 mg total) by mouth daily., Disp: 90 tablet, Rfl: 1 ?  Melatonin 200 MCG TABS, Take 1 tablet by mouth daily., Disp: , Rfl:  ?  Semaglutide, 1 MG/DOSE, (OZEMPIC, 1 MG/DOSE,) 4 MG/3ML SOPN, Inject 1 mg into the skin every 7 (seven) days., Disp: 3 mL, Rfl: 2 ?  Testosterone (ANDROGEL PUMP) 20.25 MG/ACT (1.62%) GEL, Apply 1 Pump onto the skin in the morning every day, Disp: 75 g, Rfl: 1 ?  traZODone (DESYREL) 100 MG tablet, Take 1 tablet (100 mg total) by mouth every evening., Disp: 90 tablet, Rfl: 1 ?  Cholecalciferol 1.25 MG (50000 UT) capsule, Take 1 capsule (50,000 Units total) by mouth every 7 (seven) days., Disp: 12 capsule, Rfl: 1  ? ?No Known Allergies ? ?Past Medical History:  ?Diagnosis Date  ? Hypertension   ?  ? ?Past Surgical History:  ?Procedure Laterality Date  ? KNEE SURGERY Right 10/1993  ? ? ?Family History  ?Problem Relation Age of Onset  ? Diabetes Mother   ? Hypertension Father   ? Aortic dissection Father   ? CAD Maternal Grandmother   ? ? ?Social History  ? ?Tobacco Use  ? Smoking status: Never  ? Smokeless tobacco: Never  ?Substance Use Topics  ? Alcohol use: Not Currently  ? Drug use: Never  ? ? ?ROS ? ? ?Objective:  ? ?Vitals: ?BP 133/89 (BP Location: Right Arm)   Pulse (!) 105    Temp 98.8 ?F (37.1 ?C) (Oral)   Resp 18   LMP  (Approximate)   SpO2 95%  ? ?Physical Exam ?Constitutional:   ?   General: She is not in acute distress. ?   Appearance: Normal appearance. She is well-developed and normal weight. She is not ill-appearing, toxic-appearing or diaphoretic.  ?HENT:  ?   Head: Normocephalic and atraumatic.  ?   Right Ear: Tympanic membrane, ear canal and external ear normal. No drainage or tenderness. No middle ear effusion. There is no impacted cerumen. Tympanic membrane is not erythematous.  ?   Left Ear: Tympanic membrane, ear canal and external ear normal. No drainage or tenderness.  No middle ear effusion. There is no impacted cerumen. Tympanic membrane is not erythematous.  ?   Nose: Nose normal. No congestion or rhinorrhea.  ?   Mouth/Throat:  ?   Mouth: Mucous membranes are moist. No oral lesions.  ?   Pharynx: No pharyngeal swelling, oropharyngeal exudate, posterior oropharyngeal erythema or uvula swelling.  ?   Tonsils: No tonsillar exudate or tonsillar abscesses.  ?Eyes:  ?   General: No scleral icterus.    ?   Right eye: No discharge.     ?   Left eye: No discharge.  ?   Extraocular Movements: Extraocular movements intact.  ?   Right eye: Normal extraocular motion.  ?   Left  eye: Normal extraocular motion.  ?   Conjunctiva/sclera: Conjunctivae normal.  ?Cardiovascular:  ?   Rate and Rhythm: Normal rate.  ?   Heart sounds: No murmur heard. ?  No friction rub. No gallop.  ?Pulmonary:  ?   Effort: Pulmonary effort is normal. No respiratory distress.  ?   Breath sounds: No stridor. No wheezing, rhonchi or rales.  ?Chest:  ?   Chest wall: No tenderness.  ?Musculoskeletal:  ?   Cervical back: Normal range of motion and neck supple.  ?Lymphadenopathy:  ?   Cervical: No cervical adenopathy.  ?Skin: ?   General: Skin is warm and dry.  ?Neurological:  ?   General: No focal deficit present.  ?   Mental Status: She is alert and oriented to person, place, and time.  ?Psychiatric:     ?    Mood and Affect: Mood normal.     ?   Behavior: Behavior normal.  ? ? ?Assessment and Plan :  ? ?PDMP not reviewed this encounter. ? ?1. Acute rhinitis   ?2. Sinus pain   ?3. Subacute cough   ?4. History of sinusitis   ? ?Will hold off on more antibiotics. Patient was agreeable to antibiotic stewardship. Use prednisone. Maintain Zyrtec. Add Flonase daily and Sudafed prn once done with the prednisone course. Deferred imaging given clear cardiopulmonary exam, hemodynamically stable vital signs.  Given timeline of illness will defer COVID testing. Counseled patient on potential for adverse effects with medications prescribed/recommended today, ER and return-to-clinic precautions discussed, patient verbalized understanding. ? ?  ?Jaynee Eagles, PA-C ?07/08/21 1227 ? ?

## 2021-07-08 NOTE — ED Triage Notes (Signed)
Pt reports cough and sinus pressure x 4-54 weeks. Reports symptoms improved when she had antibiotic on Jun 16, 2021.  ?

## 2021-07-18 ENCOUNTER — Other Ambulatory Visit: Payer: Self-pay | Admitting: Obstetrics and Gynecology

## 2021-07-18 ENCOUNTER — Ambulatory Visit
Admission: RE | Admit: 2021-07-18 | Discharge: 2021-07-18 | Disposition: A | Discharge status: Home / Self Care | Payer: 59 | Source: Ambulatory Visit | Attending: Obstetrics and Gynecology | Admitting: Obstetrics and Gynecology

## 2021-07-18 ENCOUNTER — Other Ambulatory Visit: Payer: Self-pay

## 2021-07-18 DIAGNOSIS — R922 Inconclusive mammogram: Secondary | ICD-10-CM | POA: Diagnosis not present

## 2021-07-18 DIAGNOSIS — R928 Other abnormal and inconclusive findings on diagnostic imaging of breast: Secondary | ICD-10-CM

## 2021-07-18 DIAGNOSIS — N6002 Solitary cyst of left breast: Secondary | ICD-10-CM | POA: Diagnosis not present

## 2021-07-18 DIAGNOSIS — N6320 Unspecified lump in the left breast, unspecified quadrant: Secondary | ICD-10-CM

## 2021-07-28 ENCOUNTER — Other Ambulatory Visit (HOSPITAL_COMMUNITY): Payer: Self-pay

## 2021-08-09 ENCOUNTER — Other Ambulatory Visit (HOSPITAL_COMMUNITY): Payer: Self-pay

## 2021-08-29 DIAGNOSIS — R739 Hyperglycemia, unspecified: Secondary | ICD-10-CM | POA: Diagnosis not present

## 2021-08-29 DIAGNOSIS — Z13220 Encounter for screening for lipoid disorders: Secondary | ICD-10-CM | POA: Diagnosis not present

## 2021-08-29 DIAGNOSIS — Z1329 Encounter for screening for other suspected endocrine disorder: Secondary | ICD-10-CM | POA: Diagnosis not present

## 2021-08-29 DIAGNOSIS — K219 Gastro-esophageal reflux disease without esophagitis: Secondary | ICD-10-CM | POA: Diagnosis not present

## 2021-08-29 DIAGNOSIS — E782 Mixed hyperlipidemia: Secondary | ICD-10-CM | POA: Diagnosis not present

## 2021-08-29 DIAGNOSIS — I10 Essential (primary) hypertension: Secondary | ICD-10-CM | POA: Diagnosis not present

## 2021-08-29 DIAGNOSIS — E7849 Other hyperlipidemia: Secondary | ICD-10-CM | POA: Diagnosis not present

## 2021-09-02 ENCOUNTER — Other Ambulatory Visit (HOSPITAL_COMMUNITY): Payer: Self-pay

## 2021-09-02 DIAGNOSIS — R739 Hyperglycemia, unspecified: Secondary | ICD-10-CM | POA: Diagnosis not present

## 2021-09-02 DIAGNOSIS — E7849 Other hyperlipidemia: Secondary | ICD-10-CM | POA: Diagnosis not present

## 2021-09-02 DIAGNOSIS — Z6831 Body mass index (BMI) 31.0-31.9, adult: Secondary | ICD-10-CM | POA: Diagnosis not present

## 2021-09-02 DIAGNOSIS — I10 Essential (primary) hypertension: Secondary | ICD-10-CM | POA: Diagnosis not present

## 2021-09-02 MED ORDER — TRAZODONE HCL 100 MG PO TABS
100.0000 mg | ORAL_TABLET | Freq: Every evening | ORAL | 1 refills | Status: DC
  Filled 2021-09-02 – 2021-10-09 (×2): qty 90, 90d supply, fill #0
  Filled 2021-12-20 – 2022-01-05 (×2): qty 90, 90d supply, fill #1

## 2021-09-05 ENCOUNTER — Other Ambulatory Visit (HOSPITAL_COMMUNITY): Payer: Self-pay

## 2021-09-05 MED ORDER — OZEMPIC (0.25 OR 0.5 MG/DOSE) 2 MG/3ML ~~LOC~~ SOPN
0.2500 mg | PEN_INJECTOR | SUBCUTANEOUS | 0 refills | Status: DC
  Filled 2021-09-05: qty 3, 28d supply, fill #0

## 2021-10-09 ENCOUNTER — Other Ambulatory Visit (HOSPITAL_COMMUNITY): Payer: Self-pay

## 2021-10-10 ENCOUNTER — Other Ambulatory Visit (HOSPITAL_COMMUNITY): Payer: Self-pay

## 2021-10-10 MED ORDER — LOSARTAN POTASSIUM 50 MG PO TABS
50.0000 mg | ORAL_TABLET | Freq: Every day | ORAL | 1 refills | Status: DC
  Filled 2021-10-10: qty 90, 90d supply, fill #0
  Filled 2021-12-20: qty 90, 90d supply, fill #1
  Filled 2022-01-05: qty 90, 90d supply, fill #0

## 2021-12-20 ENCOUNTER — Other Ambulatory Visit (HOSPITAL_COMMUNITY): Payer: Self-pay

## 2022-01-05 ENCOUNTER — Other Ambulatory Visit (HOSPITAL_COMMUNITY): Payer: Self-pay

## 2022-01-20 ENCOUNTER — Other Ambulatory Visit: Payer: 59

## 2022-02-15 DIAGNOSIS — D239 Other benign neoplasm of skin, unspecified: Secondary | ICD-10-CM | POA: Diagnosis not present

## 2022-02-15 DIAGNOSIS — D2261 Melanocytic nevi of right upper limb, including shoulder: Secondary | ICD-10-CM | POA: Diagnosis not present

## 2022-02-15 DIAGNOSIS — Z1283 Encounter for screening for malignant neoplasm of skin: Secondary | ICD-10-CM | POA: Diagnosis not present

## 2022-02-15 DIAGNOSIS — L570 Actinic keratosis: Secondary | ICD-10-CM | POA: Diagnosis not present

## 2022-02-15 DIAGNOSIS — D485 Neoplasm of uncertain behavior of skin: Secondary | ICD-10-CM | POA: Diagnosis not present

## 2022-02-15 DIAGNOSIS — D2272 Melanocytic nevi of left lower limb, including hip: Secondary | ICD-10-CM | POA: Diagnosis not present

## 2022-04-01 ENCOUNTER — Other Ambulatory Visit (HOSPITAL_COMMUNITY): Payer: Self-pay

## 2022-04-04 ENCOUNTER — Other Ambulatory Visit (HOSPITAL_COMMUNITY): Payer: Self-pay

## 2022-04-04 MED ORDER — LOSARTAN POTASSIUM 50 MG PO TABS
50.0000 mg | ORAL_TABLET | Freq: Every day | ORAL | 1 refills | Status: DC
  Filled 2022-04-04: qty 90, 90d supply, fill #0
  Filled 2022-07-03: qty 90, 90d supply, fill #1

## 2022-04-04 MED ORDER — TRAZODONE HCL 100 MG PO TABS
100.0000 mg | ORAL_TABLET | Freq: Every evening | ORAL | 1 refills | Status: DC
  Filled 2022-04-04: qty 90, 90d supply, fill #0
  Filled 2022-07-03: qty 90, 90d supply, fill #1

## 2022-04-05 ENCOUNTER — Other Ambulatory Visit (HOSPITAL_COMMUNITY): Payer: Self-pay

## 2022-05-31 ENCOUNTER — Telehealth: Payer: 59 | Admitting: Nurse Practitioner

## 2022-05-31 DIAGNOSIS — J0100 Acute maxillary sinusitis, unspecified: Secondary | ICD-10-CM | POA: Diagnosis not present

## 2022-05-31 MED ORDER — AMOXICILLIN-POT CLAVULANATE 875-125 MG PO TABS: 1.0000 | ORAL_TABLET | Freq: Two times a day (BID) | ORAL | 0 refills | Status: DC

## 2022-05-31 NOTE — Progress Notes (Signed)

## 2022-07-03 ENCOUNTER — Other Ambulatory Visit: Payer: Self-pay

## 2022-07-14 DIAGNOSIS — Z01419 Encounter for gynecological examination (general) (routine) without abnormal findings: Secondary | ICD-10-CM | POA: Diagnosis not present

## 2022-07-14 DIAGNOSIS — Z6833 Body mass index (BMI) 33.0-33.9, adult: Secondary | ICD-10-CM | POA: Diagnosis not present

## 2022-07-14 DIAGNOSIS — Z1211 Encounter for screening for malignant neoplasm of colon: Secondary | ICD-10-CM | POA: Diagnosis not present

## 2022-07-20 ENCOUNTER — Encounter (INDEPENDENT_AMBULATORY_CARE_PROVIDER_SITE_OTHER): Payer: Self-pay | Admitting: *Deleted

## 2022-08-29 DIAGNOSIS — E7849 Other hyperlipidemia: Secondary | ICD-10-CM | POA: Diagnosis not present

## 2022-08-29 DIAGNOSIS — I10 Essential (primary) hypertension: Secondary | ICD-10-CM | POA: Diagnosis not present

## 2022-08-29 DIAGNOSIS — Z1329 Encounter for screening for other suspected endocrine disorder: Secondary | ICD-10-CM | POA: Diagnosis not present

## 2022-08-29 DIAGNOSIS — R739 Hyperglycemia, unspecified: Secondary | ICD-10-CM | POA: Diagnosis not present

## 2022-08-29 DIAGNOSIS — Z0001 Encounter for general adult medical examination with abnormal findings: Secondary | ICD-10-CM | POA: Diagnosis not present

## 2022-08-29 DIAGNOSIS — K219 Gastro-esophageal reflux disease without esophagitis: Secondary | ICD-10-CM | POA: Diagnosis not present

## 2022-09-01 ENCOUNTER — Other Ambulatory Visit (HOSPITAL_COMMUNITY): Payer: Self-pay

## 2022-09-01 DIAGNOSIS — E7849 Other hyperlipidemia: Secondary | ICD-10-CM | POA: Diagnosis not present

## 2022-09-01 DIAGNOSIS — R739 Hyperglycemia, unspecified: Secondary | ICD-10-CM | POA: Diagnosis not present

## 2022-09-01 DIAGNOSIS — Z6832 Body mass index (BMI) 32.0-32.9, adult: Secondary | ICD-10-CM | POA: Diagnosis not present

## 2022-09-01 DIAGNOSIS — I10 Essential (primary) hypertension: Secondary | ICD-10-CM | POA: Diagnosis not present

## 2022-09-01 DIAGNOSIS — R059 Cough, unspecified: Secondary | ICD-10-CM | POA: Diagnosis not present

## 2022-09-01 DIAGNOSIS — J45909 Unspecified asthma, uncomplicated: Secondary | ICD-10-CM | POA: Diagnosis not present

## 2022-09-01 MED ORDER — TRAZODONE HCL 100 MG PO TABS
100.0000 mg | ORAL_TABLET | Freq: Every evening | ORAL | 3 refills | Status: DC
  Filled 2022-09-01 – 2022-09-15 (×3): qty 90, 90d supply, fill #0
  Filled 2023-01-10: qty 90, 90d supply, fill #1
  Filled 2023-04-06: qty 90, 90d supply, fill #2
  Filled 2023-07-10: qty 90, 90d supply, fill #3

## 2022-09-01 MED ORDER — LOSARTAN POTASSIUM 50 MG PO TABS
50.0000 mg | ORAL_TABLET | Freq: Every day | ORAL | 3 refills | Status: DC
  Filled 2022-09-01 – 2022-09-15 (×3): qty 90, 90d supply, fill #0
  Filled 2023-01-10: qty 90, 90d supply, fill #1
  Filled 2023-04-06: qty 90, 90d supply, fill #2
  Filled 2023-07-10: qty 90, 90d supply, fill #3

## 2022-09-11 ENCOUNTER — Other Ambulatory Visit (HOSPITAL_COMMUNITY): Payer: Self-pay

## 2022-09-11 ENCOUNTER — Other Ambulatory Visit: Payer: Self-pay

## 2022-09-11 MED ORDER — BUDESONIDE-FORMOTEROL FUMARATE 160-4.5 MCG/ACT IN AERO
2.0000 | INHALATION_SPRAY | Freq: Two times a day (BID) | RESPIRATORY_TRACT | 5 refills | Status: DC
  Filled 2022-09-11: qty 10.2, 30d supply, fill #0
  Filled 2022-10-17: qty 10.2, 30d supply, fill #1
  Filled 2023-01-10: qty 10.2, 30d supply, fill #2
  Filled 2023-03-20: qty 10.2, 30d supply, fill #3
  Filled 2023-05-08: qty 10.2, 30d supply, fill #4
  Filled 2023-06-04: qty 10.2, 30d supply, fill #5

## 2022-09-13 ENCOUNTER — Other Ambulatory Visit (HOSPITAL_COMMUNITY): Payer: Self-pay

## 2022-09-13 ENCOUNTER — Encounter (HOSPITAL_COMMUNITY): Payer: Self-pay | Admitting: Obstetrics and Gynecology

## 2022-09-15 ENCOUNTER — Other Ambulatory Visit (HOSPITAL_COMMUNITY): Payer: Self-pay

## 2022-09-15 ENCOUNTER — Other Ambulatory Visit: Payer: Self-pay

## 2022-10-17 ENCOUNTER — Other Ambulatory Visit: Payer: Self-pay

## 2022-11-07 IMAGING — MG MM DIGITAL DIAGNOSTIC UNILAT*L* IMPLANT W/ TOMO W/ CAD
6 series · 6 of 18 positions shown · non-contrast
Comparison: Previous exam(s).

CLINICAL DATA: Patient was recalled from screening mammogram for a
left breast mass.

EXAM:
DIGITAL DIAGNOSTIC UNILATERAL LEFT MAMMOGRAM WITH IMPLANTS, CAD AND
TOMOSYNTHESIS; ULTRASOUND LEFT BREAST LIMITED
TECHNIQUE: Left digital diagnostic mammography and breast tomosynthesis was
performed. The images were evaluated with computer-aided detection.
Standard and/or implant displaced views were performed.; Targeted
ultrasound examination of the left breast was performed.

[L CC synth-2D (1 of 2)]
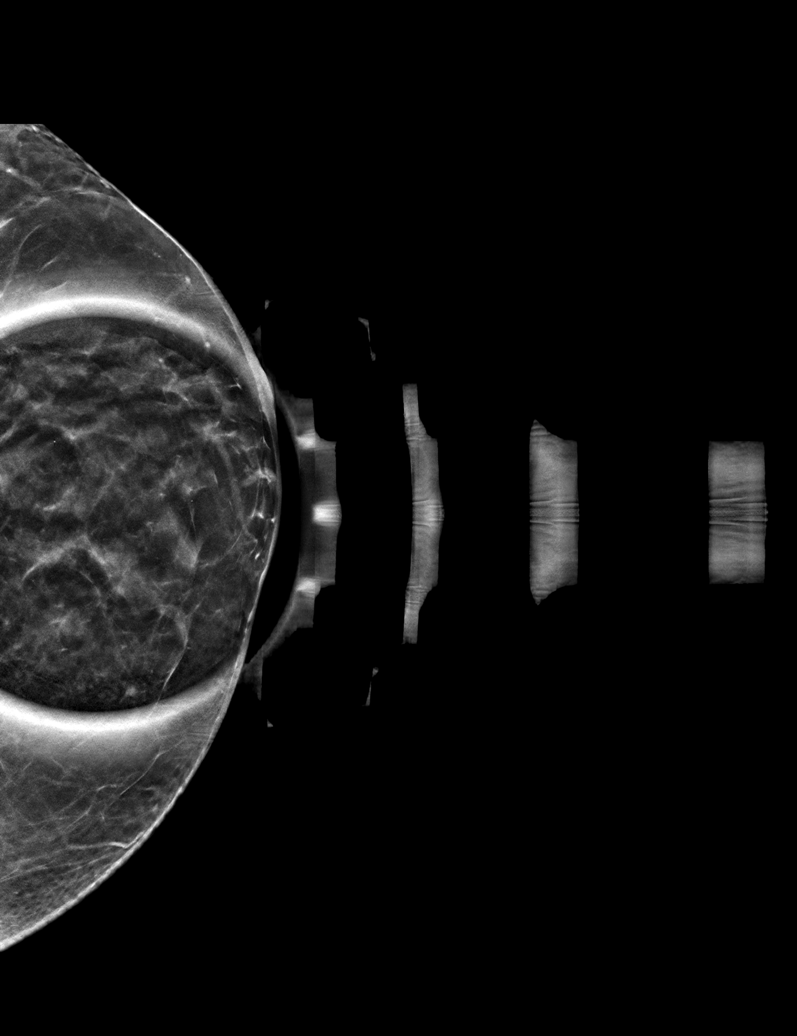

[L CC synth-2D (2 of 2)]
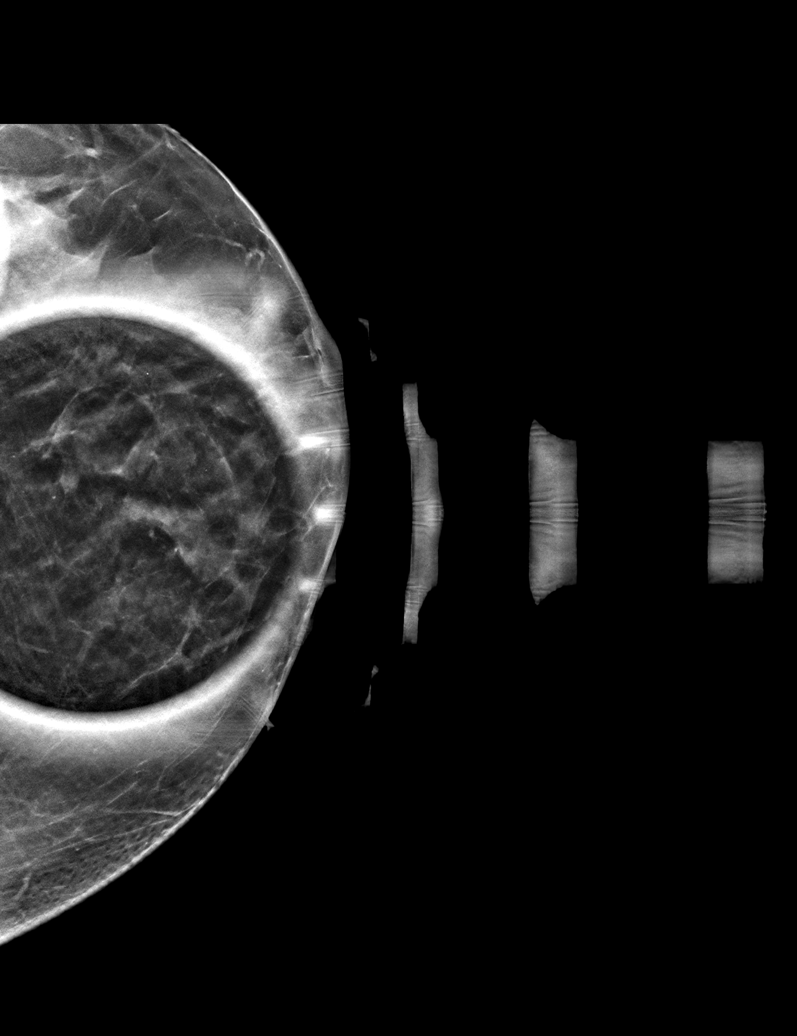

[L ML synth-2D]
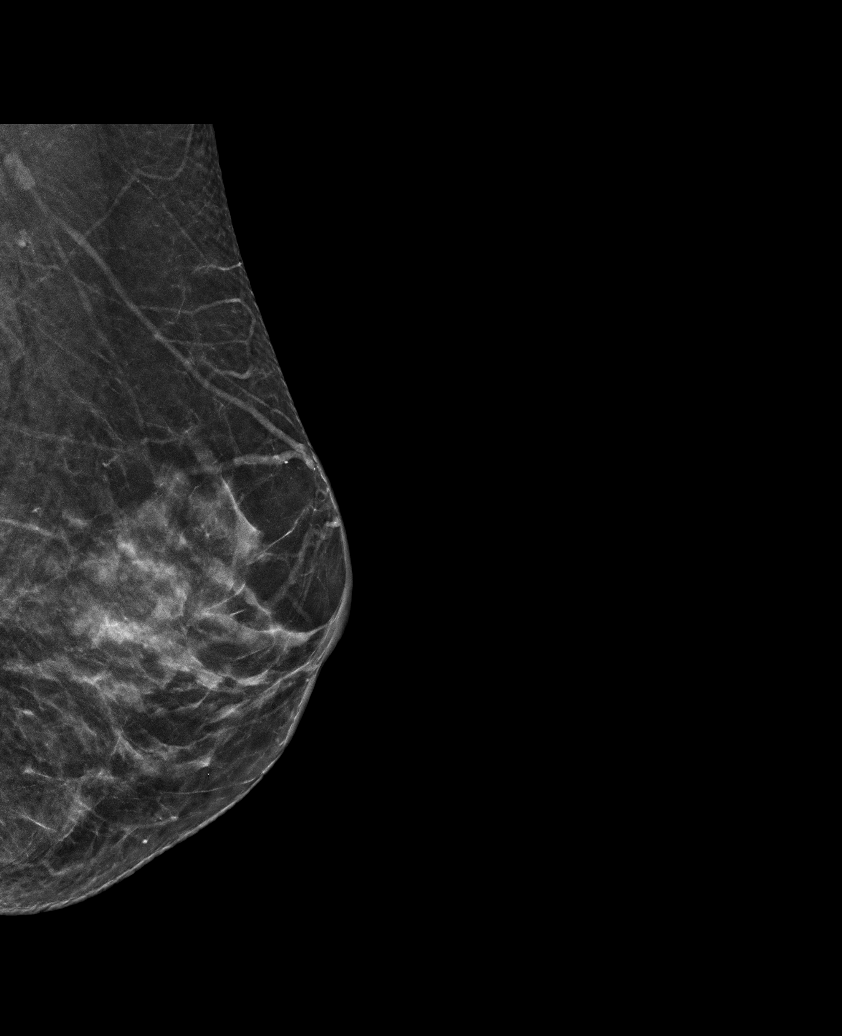

[L MLID BREAST TOMOSYNTHESIS IMAGE tomo · tomo slice 28/55.0]
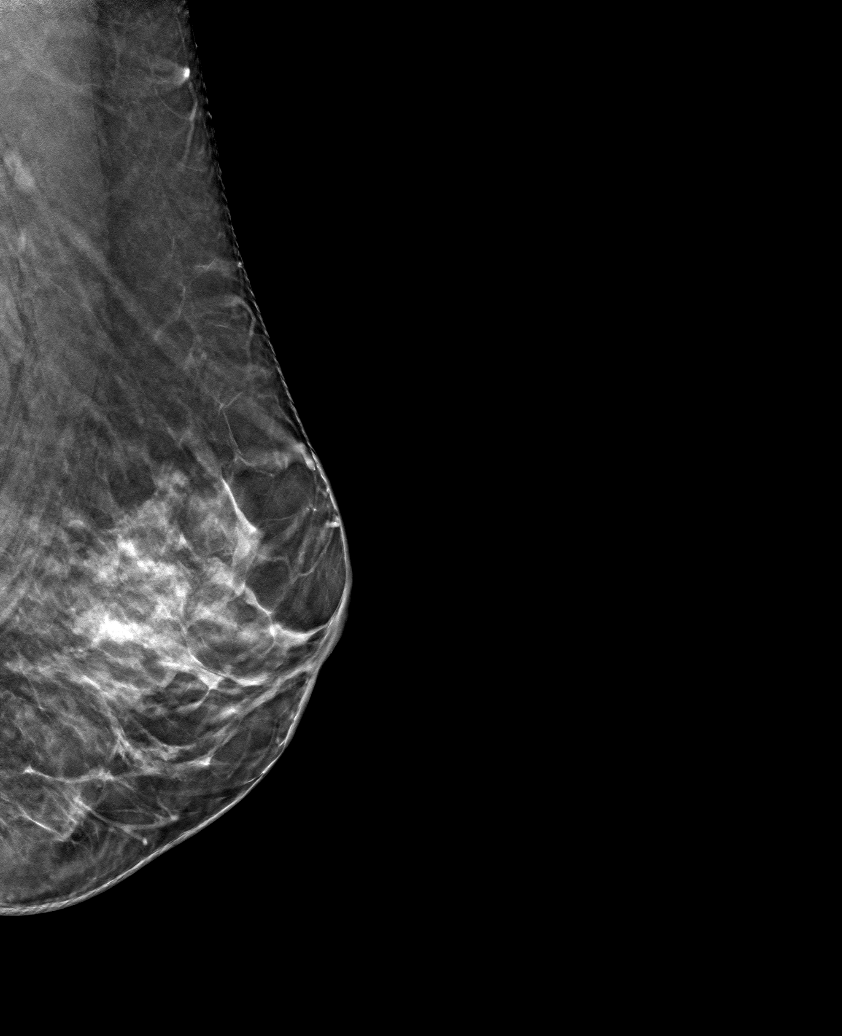

[L CC tomo · tomo slice 23/44.0]
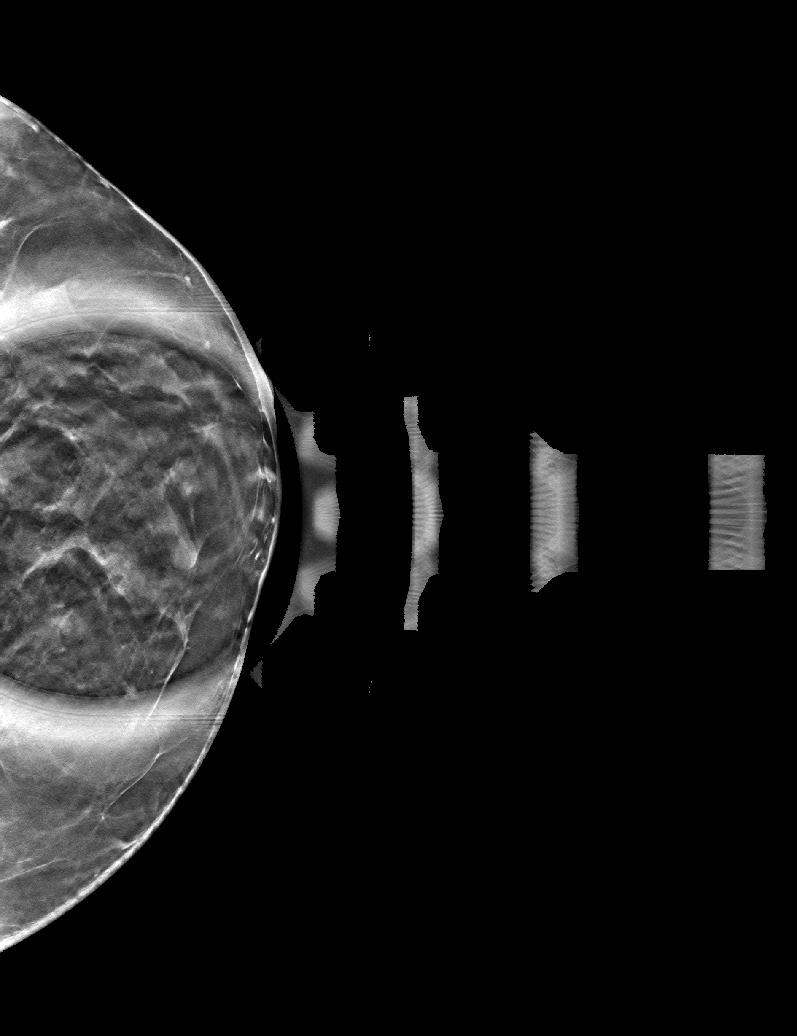

[L CCID BREAST TOMOSYNTHESIS IMAGE tomo · tomo slice 22/43.0]
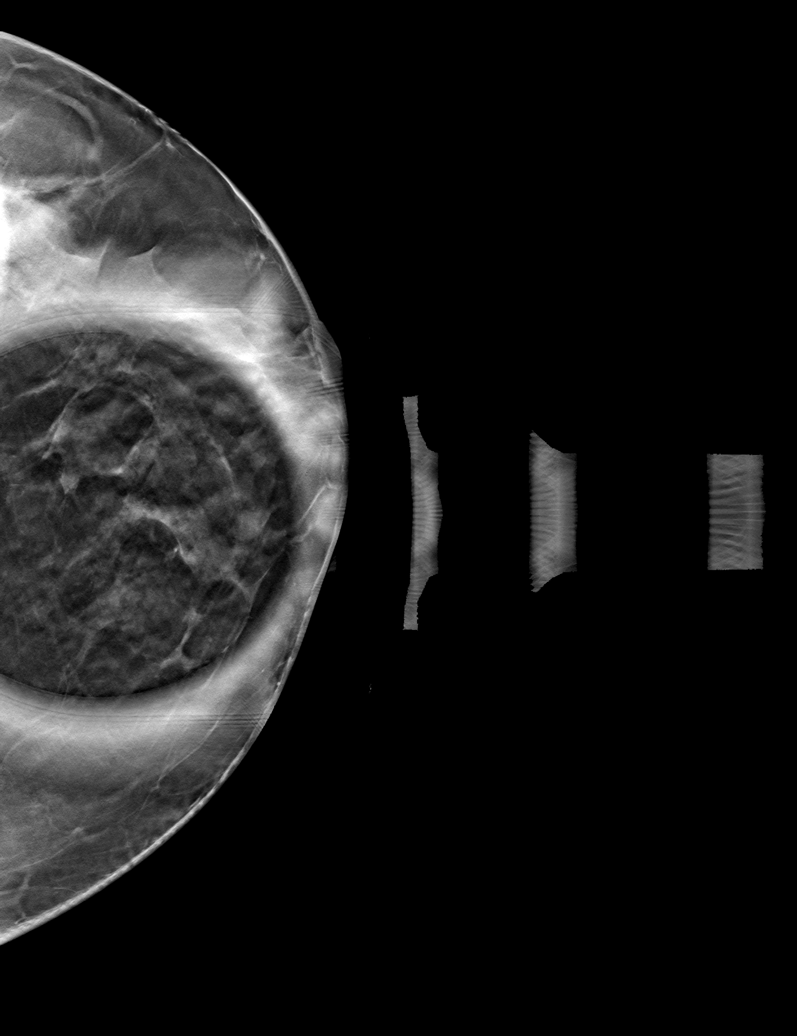

[6 of 18 positions shown; findings below may reference images not displayed]

ACR Breast Density Category c: The breast tissue is heterogeneously
dense, which may obscure small masses.
FINDINGS: Additional imaging of the left breast was performed. There is
persistence of a circumscribed 6 mm mass in the lateral aspect of
the breast only seen on the cc view. The patient has retropectoral
implants.

On physical exam, I do not palpate a discrete mass in the 9 o'clock
region of the left breast.

Targeted ultrasound is performed, showing a well-circumscribed
hypoechoic mass with some internal mobile foci in the left breast at
9 o'clock 3 cm from the nipple measuring 7 x 5 x 5 mm. It is likely
a complex cyst.
IMPRESSION: Probable benign complex cyst in the 9 o'clock region of the left
breast.

RECOMMENDATION:
Short-term interval follow-up left breast ultrasound in 6 months is
recommended.

I have discussed the findings and recommendations with the patient.
If applicable, a reminder letter will be sent to the patient
regarding the next appointment.

BI-RADS CATEGORY  3: Probably benign.

## 2023-01-10 ENCOUNTER — Other Ambulatory Visit (HOSPITAL_COMMUNITY): Payer: Self-pay

## 2023-01-11 ENCOUNTER — Encounter (INDEPENDENT_AMBULATORY_CARE_PROVIDER_SITE_OTHER): Payer: Self-pay | Admitting: *Deleted

## 2023-03-20 ENCOUNTER — Other Ambulatory Visit (HOSPITAL_COMMUNITY): Payer: Self-pay

## 2023-03-28 DIAGNOSIS — R5382 Chronic fatigue, unspecified: Secondary | ICD-10-CM | POA: Diagnosis not present

## 2023-03-28 DIAGNOSIS — R891 Abnormal level of hormones in specimens from other organs, systems and tissues: Secondary | ICD-10-CM | POA: Diagnosis not present

## 2023-03-28 DIAGNOSIS — L658 Other specified nonscarring hair loss: Secondary | ICD-10-CM | POA: Diagnosis not present

## 2023-04-06 ENCOUNTER — Other Ambulatory Visit (HOSPITAL_COMMUNITY): Payer: Self-pay

## 2023-05-09 ENCOUNTER — Other Ambulatory Visit (HOSPITAL_COMMUNITY): Payer: Self-pay

## 2023-06-04 ENCOUNTER — Other Ambulatory Visit (HOSPITAL_COMMUNITY): Payer: Self-pay

## 2023-07-10 ENCOUNTER — Other Ambulatory Visit: Payer: Self-pay

## 2023-07-10 ENCOUNTER — Other Ambulatory Visit (HOSPITAL_COMMUNITY): Payer: Self-pay

## 2023-07-10 MED ORDER — BUDESONIDE-FORMOTEROL FUMARATE 160-4.5 MCG/ACT IN AERO
2.0000 | INHALATION_SPRAY | Freq: Two times a day (BID) | RESPIRATORY_TRACT | 5 refills | Status: DC
  Filled 2023-07-10: qty 10.2, 30d supply, fill #0

## 2023-07-11 ENCOUNTER — Other Ambulatory Visit (HOSPITAL_COMMUNITY): Payer: Self-pay

## 2023-09-06 ENCOUNTER — Other Ambulatory Visit (HOSPITAL_COMMUNITY): Payer: Self-pay

## 2023-09-06 ENCOUNTER — Other Ambulatory Visit: Payer: Self-pay

## 2023-09-06 MED ORDER — TRAZODONE HCL 100 MG PO TABS
100.0000 mg | ORAL_TABLET | Freq: Every evening | ORAL | 3 refills | Status: AC
  Filled 2023-09-06 – 2023-10-04 (×2): qty 90, 90d supply, fill #0
  Filled 2024-01-02: qty 90, 90d supply, fill #1
  Filled 2024-04-11: qty 90, 90d supply, fill #2

## 2023-09-06 MED ORDER — LOSARTAN POTASSIUM-HCTZ 50-12.5 MG PO TABS
1.0000 | ORAL_TABLET | Freq: Every day | ORAL | 3 refills | Status: AC
  Filled 2023-09-06: qty 90, 90d supply, fill #0
  Filled 2024-01-02: qty 90, 90d supply, fill #1
  Filled 2024-04-11: qty 90, 90d supply, fill #2

## 2023-10-04 ENCOUNTER — Emergency Department (HOSPITAL_COMMUNITY)
Admission: EM | Admit: 2023-10-04 | Discharge: 2023-10-05 | Disposition: A | Discharge status: Home / Self Care | Attending: Emergency Medicine | Admitting: Emergency Medicine

## 2023-10-04 ENCOUNTER — Encounter (HOSPITAL_COMMUNITY): Payer: Self-pay

## 2023-10-04 ENCOUNTER — Other Ambulatory Visit: Payer: Self-pay

## 2023-10-04 ENCOUNTER — Other Ambulatory Visit (HOSPITAL_COMMUNITY): Payer: Self-pay

## 2023-10-04 ENCOUNTER — Other Ambulatory Visit: Payer: Self-pay | Admitting: Oncology

## 2023-10-04 DIAGNOSIS — T7840XA Allergy, unspecified, initial encounter: Secondary | ICD-10-CM | POA: Diagnosis not present

## 2023-10-04 DIAGNOSIS — Z7689 Persons encountering health services in other specified circumstances: Secondary | ICD-10-CM | POA: Diagnosis not present

## 2023-10-04 LAB — BASIC METABOLIC PANEL WITH GFR
Anion gap: 11 (ref 5–15)
BUN: 11 mg/dL (ref 6–20)
CO2: 25 mmol/L (ref 22–32)
Calcium: 9.2 mg/dL (ref 8.9–10.3)
Chloride: 101 mmol/L (ref 98–111)
Creatinine, Ser: 0.79 mg/dL (ref 0.44–1.00)
GFR, Estimated: >60 mL/min (ref >60)
Glucose, Bld: 103 mg/dL — ABNORMAL HIGH (ref 70–99)
Potassium: 3.5 mmol/L (ref 3.5–5.1)
Sodium: 137 mmol/L (ref 135–145)

## 2023-10-04 LAB — CBC WITH DIFFERENTIAL/PLATELET
Abs Immature Granulocytes: 0.02 10*3/uL (ref 0.00–0.07)
Basophils Absolute: 0 10*3/uL (ref 0.0–0.1)
Basophils Relative: 0 %
Eosinophils Absolute: 0.1 10*3/uL (ref 0.0–0.5)
Eosinophils Relative: 1 %
HCT: 41.5 % (ref 36.0–46.0)
Hemoglobin: 14.2 g/dL (ref 12.0–15.0)
Immature Granulocytes: 0 %
Lymphocytes Relative: 21 %
Lymphs Abs: 2 10*3/uL (ref 0.7–4.0)
MCH: 30.9 pg (ref 26.0–34.0)
MCHC: 34.2 g/dL (ref 30.0–36.0)
MCV: 90.2 fL (ref 80.0–100.0)
Monocytes Absolute: 0.4 10*3/uL (ref 0.1–1.0)
Monocytes Relative: 5 %
Neutro Abs: 7 10*3/uL (ref 1.7–7.7)
Neutrophils Relative %: 73 %
Platelets: 232 10*3/uL (ref 150–400)
RBC: 4.6 MIL/uL (ref 3.87–5.11)
RDW: 13.2 % (ref 11.5–15.5)
WBC: 9.6 10*3/uL (ref 4.0–10.5)
nRBC: 0 % (ref 0.0–0.2)

## 2023-10-04 MED ORDER — PREDNISONE 10 MG PO TABS: 20.0000 mg | ORAL_TABLET | Freq: Every day | ORAL | 0 refills | Status: DC

## 2023-10-04 MED ORDER — EPINEPHRINE 0.3 MG/0.3ML IJ SOAJ
0.3000 mg | Freq: Once | INTRAMUSCULAR | Status: AC
  Administered 2023-10-04: 0.3 mg via INTRAMUSCULAR
  Filled 2023-10-04: qty 0.3

## 2023-10-04 MED ORDER — EPINEPHRINE 0.3 MG/0.3ML IJ SOAJ: 0.3000 mg | INTRAMUSCULAR | 1 refills | Status: AC | PRN

## 2023-10-04 MED ORDER — PREDNISONE 20 MG PO TABS: 40.0000 mg | ORAL_TABLET | Freq: Every day | ORAL | 0 refills | Status: DC

## 2023-10-04 MED ORDER — TRIAMCINOLONE ACETONIDE 0.5 % EX OINT: 1.0000 | TOPICAL_OINTMENT | Freq: Two times a day (BID) | CUTANEOUS | 0 refills | Status: DC

## 2023-10-04 MED ORDER — SODIUM CHLORIDE 0.9 % IV BOLUS
500.0000 mL | Freq: Once | INTRAVENOUS | Status: AC
  Administered 2023-10-04: 500 mL via INTRAVENOUS

## 2023-10-04 MED ORDER — FAMOTIDINE 20 MG PO TABS: 20.0000 mg | ORAL_TABLET | Freq: Two times a day (BID) | ORAL | 0 refills | Status: DC

## 2023-10-04 MED ORDER — FAMOTIDINE IN NACL 20-0.9 MG/50ML-% IV SOLN
20.0000 mg | Freq: Once | INTRAVENOUS | Status: AC
  Administered 2023-10-04: 20 mg via INTRAVENOUS
  Filled 2023-10-04: qty 50

## 2023-10-04 MED ORDER — METHYLPREDNISOLONE SODIUM SUCC 125 MG IJ SOLR
125.0000 mg | Freq: Once | INTRAMUSCULAR | Status: AC
  Administered 2023-10-04: 125 mg via INTRAVENOUS
  Filled 2023-10-04: qty 2

## 2023-10-04 MED ORDER — DIPHENHYDRAMINE HCL 50 MG/ML IJ SOLN
12.5000 mg | Freq: Once | INTRAMUSCULAR | Status: AC
  Administered 2023-10-04: 12.5 mg via INTRAVENOUS
  Filled 2023-10-04: qty 1

## 2023-10-04 NOTE — ED Triage Notes (Signed)
 Pt report:  Allergic Reaction Lip Swelling Hives Took 7 Benadryl (25mg )

## 2023-10-04 NOTE — Discharge Instructions (Signed)
 Take Benadryl 25 mg 4 times a day.  Follow-up with your doctor tomorrow if possible.  Return here if getting worse.  If the allergic reaction returns you should give yourself an EpiPen shot and call 911 to take you to the hospital.  Do not take your blood pressure medicine until your doctor tells you to

## 2023-10-05 DIAGNOSIS — Z01419 Encounter for gynecological examination (general) (routine) without abnormal findings: Secondary | ICD-10-CM | POA: Diagnosis not present

## 2023-10-05 DIAGNOSIS — Z6833 Body mass index (BMI) 33.0-33.9, adult: Secondary | ICD-10-CM | POA: Diagnosis not present

## 2023-10-05 DIAGNOSIS — N926 Irregular menstruation, unspecified: Secondary | ICD-10-CM | POA: Diagnosis not present

## 2023-10-05 DIAGNOSIS — Z124 Encounter for screening for malignant neoplasm of cervix: Secondary | ICD-10-CM | POA: Diagnosis not present

## 2023-10-05 DIAGNOSIS — Z1151 Encounter for screening for human papillomavirus (HPV): Secondary | ICD-10-CM | POA: Diagnosis not present

## 2023-10-05 NOTE — ED Provider Notes (Signed)
 [facility] AT [facility] Provider Note   CSN: [id number] Arrival date & time: 10/04/23  2051     History  Chief Complaint  Patient presents with   Allergic Reaction    Karen Parker is a 53 y.o. female.  Patient states that she started having swelling in her face and itching on her torso with a rash to her torso.  She took 7 Benadryl 's today.  The history is provided by the patient and medical records. No language interpreter was used.  Allergic Reaction Presenting symptoms: itching and rash   Severity:  Moderate Prior allergic episodes:  No prior episodes Context: not animal exposure   Relieved by:  Nothing Worsened by:  Nothing Ineffective treatments:  Antihistamines      Home Medications Prior to Admission medications   Medication Sig Start Date End Date Taking? Authorizing Provider  EPINEPHrine  0.3 mg/0.3 mL IJ SOAJ injection Inject 0.3 mg into the muscle as needed for anaphylaxis. 10/04/23  Yes Cheyenne Cotta, MD  famotidine  (PEPCID ) 20 MG tablet Take 1 tablet (20 mg total) by mouth 2 (two) times daily. 10/04/23  Yes Cheyenne Cotta, MD  predniSONE  (DELTASONE ) 10 MG tablet Take 2 tablets (20 mg total) by mouth daily. 10/04/23  Yes Adrien Shankar, MD  amoxicillin -clavulanate (AUGMENTIN ) 875-125 MG tablet Take 1 tablet by mouth 2 (two) times daily. 05/31/22   Gaylyn Keas, Mary-Margaret, FNP  benzonatate  (TESSALON ) 100 MG capsule Take 1-2 capsules (100-200 mg total) by mouth 3 (three) times daily as needed for cough. 07/08/21   Adolph Hoop, PA-C  budesonide -formoterol  (SYMBICORT ) 160-4.5 MCG/ACT inhaler Inhale 2 puffs into the lungs 2 (two) times daily. 07/10/23     losartan  (COZAAR ) 50 MG tablet Take 1 tablet (50 mg total) by mouth daily. 09/01/22     losartan -hydrochlorothiazide  (HYZAAR ) 50-12.5 MG tablet Take 1 tablet by mouth daily. 09/06/23     Melatonin 200 MCG TABS Take 1 tablet by mouth daily.    [provider]   promethazine -dextromethorphan (PROMETHAZINE -DM) 6.25-15 MG/5ML syrup Take 5 mLs by mouth 4 (four) times daily as needed for cough. 07/08/21   Adolph Hoop, PA-C  Semaglutide , 1 MG/DOSE, (OZEMPIC , 1 MG/DOSE,) 4 MG/3ML SOPN Inject 1 mg into the skin every 7 (seven) days. 05/23/21     Semaglutide ,0.25 or 0.5MG /DOS, (OZEMPIC , 0.25 OR 0.5 MG/DOSE,) 2 MG/3ML SOPN Inject 0.25 mg into the skin once a week. 09/05/21     Testosterone  (ANDROGEL  PUMP) 20.25 MG/ACT (1.62%) GEL Apply 1 Pump onto the skin in the morning every day 05/23/21     traZODone  (DESYREL ) 100 MG tablet Take 1 tablet (100 mg total) by mouth every evening. 09/06/23     triamcinolone  ointment (KENALOG ) 0.5 % Apply 1 Application topically 2 (two) times daily. 10/04/23   Aurther Blue, NP  Cholecalciferol  1.25 MG (50000 UT) capsule Take 1 capsule (50,000 Units total) by mouth every 7 (seven) days. 05/23/21         Allergies    Patient has no known allergies.    Review of Systems   Review of Systems  Constitutional:  Negative for appetite change and fatigue.  HENT:  Negative for congestion, ear discharge and sinus pressure.        Swelling to lips  Eyes:  Negative for discharge.  Respiratory:  Negative for cough.   Cardiovascular:  Negative for chest pain.  Gastrointestinal:  Negative for abdominal pain and diarrhea.  Genitourinary:  Negative for frequency and hematuria.  Musculoskeletal:  Negative  for back pain.  Skin:  Positive for itching and rash.  Neurological:  Negative for seizures and headaches.  Psychiatric/Behavioral:  Negative for hallucinations.     Physical Exam Updated Vital Signs BP 102/71 (BP Location: Right Arm)   Pulse 88   Temp 97.8 F (36.6 C) (Oral)   Resp 17   Ht 5\' 7"  (1.702 m)   Wt 94.3 kg   SpO2 96%   BMI 32.58 kg/m  Physical Exam Vitals and nursing note reviewed.  Constitutional:      Appearance: She is well-developed.  HENT:     Head: Normocephalic.     Nose: Nose normal.     Mouth/Throat:      Comments: Both lips are swollen, but oropharynx appears normal Eyes:     General: No scleral icterus.    Conjunctiva/sclera: Conjunctivae normal.  Neck:     Thyroid : No thyromegaly.  Cardiovascular:     Rate and Rhythm: Normal rate and regular rhythm.     Heart sounds: No murmur heard.    No friction rub. No gallop.  Pulmonary:     Breath sounds: No stridor. No wheezing or rales.  Chest:     Chest wall: No tenderness.  Abdominal:     General: There is no distension.     Tenderness: There is no abdominal tenderness. There is no rebound.  Musculoskeletal:        General: Normal range of motion.     Cervical back: Neck supple.  Lymphadenopathy:     Cervical: No cervical adenopathy.  Skin:    Findings: Erythema and rash present.     Comments: Urticarial rash to torso  Neurological:     Mental Status: She is alert and oriented to person, place, and time.     Motor: No abnormal muscle tone.     Coordination: Coordination normal.  Psychiatric:        Behavior: Behavior normal.     ED Results / Procedures / Treatments   Labs (all labs ordered are listed, but only abnormal results are displayed) Labs Reviewed  BASIC METABOLIC PANEL WITH GFR - Abnormal; Notable for the following components:      Result Value   Glucose, Bld 103 (*)    All other components within normal limits  CBC WITH DIFFERENTIAL/PLATELET    EKG None  Radiology No results found.  Procedures Procedures    Medications Ordered in ED Medications  EPINEPHrine (EPI-PEN) injection 0.3 mg (0.3 mg Intramuscular Given 10/04/23 2134)  methylPREDNISolone sodium succinate (SOLU-MEDROL) 125 mg/2 mL injection 125 mg (125 mg Intravenous Given 10/04/23 2134)  famotidine (PEPCID) IVPB 20 mg premix (0 mg Intravenous Stopped 10/04/23 2236)  sodium chloride 0.9 % bolus 500 mL (0 mLs Intravenous Stopped 10/04/23 2237)  diphenhydrAMINE (BENADRYL) injection 12.5 mg (12.5 mg Intravenous Given 10/04/23 2237)    ED Course/  Medical Decision Making/ A&P  CRITICAL CARE Performed by: Cheyenne Cotta Total critical care time: 55 minutes Critical care time was exclusive of separately billable procedures and treating other patients. Critical care was necessary to treat or prevent imminent or life-threatening deterioration. Critical care was time spent personally by me on the following activities: development of treatment plan with patient and/or surrogate as well as nursing, discussions with consultants, evaluation of patient's response to treatment, examination of patient, obtaining history from patient or surrogate, ordering and performing treatments and interventions, ordering and review of laboratory studies, ordering and review of radiographic studies, pulse oximetry and re-evaluation of patient's condition.  Patient swelling to lips and urticarial rash improved with emergency department treatment.                               Medical Decision Making Amount and/or Complexity of Data Reviewed Labs: ordered.  Risk Prescription drug management.   Allergic reaction.  Patient improved with treatment in the emergency department and is sent home on Pepcid epinephrine prednisone  and Benadryl.  She will follow-up with her PCP        Final Clinical Impression(s) / ED Diagnoses Final diagnoses:  Allergic reaction, initial encounter    Rx / DC Orders ED Discharge Orders          Ordered    famotidine (PEPCID) 20 MG tablet  2 times daily        10/04/23 2331    predniSONE  (DELTASONE ) 10 MG tablet  Daily        10/04/23 2331    EPINEPHrine 0.3 mg/0.3 mL IJ SOAJ injection  As needed        10/04/23 2331              Breely Panik, MD 10/05/23 1201

## 2023-10-11 DIAGNOSIS — J301 Allergic rhinitis due to pollen: Secondary | ICD-10-CM | POA: Diagnosis not present

## 2023-10-11 DIAGNOSIS — T7840XA Allergy, unspecified, initial encounter: Secondary | ICD-10-CM | POA: Diagnosis not present

## 2023-11-14 ENCOUNTER — Other Ambulatory Visit (HOSPITAL_COMMUNITY): Payer: Self-pay

## 2023-11-14 ENCOUNTER — Other Ambulatory Visit: Payer: Self-pay

## 2023-11-14 MED ORDER — SCOPOLAMINE 1 MG/3DAYS TD PT72
1.0000 | MEDICATED_PATCH | TRANSDERMAL | 0 refills | Status: DC
  Filled 2023-11-14: qty 10, 30d supply, fill #0

## 2023-11-14 MED ORDER — ONDANSETRON 8 MG PO TBDP
8.0000 mg | ORAL_TABLET | Freq: Three times a day (TID) | ORAL | 0 refills | Status: DC | PRN
  Filled 2023-11-14: qty 15, 5d supply, fill #0

## 2023-11-16 ENCOUNTER — Other Ambulatory Visit (HOSPITAL_COMMUNITY): Payer: Self-pay

## 2023-11-16 MED ORDER — WEGOVY 0.25 MG/0.5ML ~~LOC~~ SOAJ
0.2500 mg | SUBCUTANEOUS | 0 refills | Status: DC
  Filled 2023-11-16: qty 2, 28d supply, fill #0

## 2023-11-23 ENCOUNTER — Ambulatory Visit: Payer: Self-pay | Admitting: Allergy & Immunology

## 2023-11-28 ENCOUNTER — Other Ambulatory Visit (HOSPITAL_COMMUNITY): Payer: Self-pay

## 2023-12-04 ENCOUNTER — Encounter (INDEPENDENT_AMBULATORY_CARE_PROVIDER_SITE_OTHER): Payer: Self-pay | Admitting: *Deleted

## 2024-01-02 ENCOUNTER — Other Ambulatory Visit (HOSPITAL_COMMUNITY): Payer: Self-pay

## 2024-01-09 ENCOUNTER — Encounter (INDEPENDENT_AMBULATORY_CARE_PROVIDER_SITE_OTHER): Payer: Self-pay | Admitting: *Deleted

## 2024-02-01 ENCOUNTER — Telehealth (INDEPENDENT_AMBULATORY_CARE_PROVIDER_SITE_OTHER): Payer: Self-pay

## 2024-02-01 NOTE — Telephone Encounter (Signed)
 Who is your primary care physician: Karen Parker  Reasons for the colonoscopy: screening  Have you had a colonoscopy before?  Yes, 2000  Do you have family history of colon cancer? unknown  Previous colonoscopy with polyps removed? unknown  Do you have a history colorectal cancer?   no  Are you diabetic? If yes, Type 1 or Type 2?    no  Do you have a prosthetic or mechanical heart valve? no  Do you have a pacemaker/defibrillator?   no  Have you had endocarditis/atrial fibrillation? no  Have you had joint replacement within the last 12 months?  no  Do you tend to be constipated or have to use laxatives? yes  Do you have any history of drugs or alchohol?  no  Do you use supplemental oxygen?  no  Have you had a stroke or heart attack within the last 6 months? no  Do you take weight loss medication?  no  For female patients: have you had a hysterectomy?  no                                     are you post menopausal?       no                                            do you still have your menstrual cycle? yes      Do you take any blood-thinning medications such as: (aspirin, warfarin, Plavix, Aggrenox)  no  If yes we need the name, milligram, dosage and who is prescribing doctor  Current Outpatient Medications on File Prior to Visit  Medication Sig Dispense Refill   EPINEPHrine  0.3 mg/0.3 mL IJ SOAJ injection Inject 0.3 mg into the muscle as needed for anaphylaxis. 2 each 1   losartan -hydrochlorothiazide  (HYZAAR ) 50-12.5 MG tablet Take 1 tablet by mouth daily. 90 tablet 3   traZODone  (DESYREL ) 100 MG tablet Take 1 tablet (100 mg total) by mouth every evening. 90 tablet 3   Vitamin D -Vitamin K (D3 + K2 PO) Take by mouth. D3 + K2 5000 international unit - 100 mcg every day     No current facility-administered medications on file prior to visit.    No Known Allergies  Primary Insurance Name: Karen Parker number where you can be reached: [phone] or [phone]

## 2024-02-04 NOTE — Telephone Encounter (Signed)
 Any room Thanks

## 2024-02-05 ENCOUNTER — Other Ambulatory Visit (HOSPITAL_BASED_OUTPATIENT_CLINIC_OR_DEPARTMENT_OTHER): Payer: Self-pay

## 2024-02-05 ENCOUNTER — Encounter (INDEPENDENT_AMBULATORY_CARE_PROVIDER_SITE_OTHER): Payer: Self-pay | Admitting: *Deleted

## 2024-02-05 MED ORDER — PEG 3350-KCL-NA BICARB-NACL 420 G PO SOLR
4000.0000 mL | Freq: Once | ORAL | 0 refills | Status: AC
  Filled 2024-02-05: qty 4000, 1d supply, fill #0

## 2024-02-05 NOTE — Telephone Encounter (Signed)
 Referral completed, TCS apt letter sent to PCP

## 2024-02-05 NOTE — Addendum Note (Signed)
 Addended by: DALLIE LIONEL RAMAN on: 02/05/2024 09:21 AM   Modules accepted: Orders

## 2024-02-05 NOTE — Telephone Encounter (Signed)
 Spoke with patient, scheduled TCS for 02/22/2024 at 8:00 am. Rx sent to pharmacy. Instructions sent to mychart.

## 2024-02-06 ENCOUNTER — Other Ambulatory Visit (HOSPITAL_BASED_OUTPATIENT_CLINIC_OR_DEPARTMENT_OTHER): Payer: Self-pay

## 2024-02-18 ENCOUNTER — Other Ambulatory Visit (HOSPITAL_BASED_OUTPATIENT_CLINIC_OR_DEPARTMENT_OTHER): Payer: Self-pay

## 2024-02-19 ENCOUNTER — Other Ambulatory Visit (HOSPITAL_BASED_OUTPATIENT_CLINIC_OR_DEPARTMENT_OTHER): Payer: Self-pay

## 2024-02-19 ENCOUNTER — Encounter (HOSPITAL_COMMUNITY)
Admission: RE | Admit: 2024-02-19 | Discharge: 2024-02-19 | Disposition: A | Discharge status: Home / Self Care | Source: Ambulatory Visit | Attending: Gastroenterology | Admitting: Gastroenterology

## 2024-02-19 ENCOUNTER — Encounter (HOSPITAL_COMMUNITY): Payer: Self-pay

## 2024-02-19 MED ORDER — FLUZONE 0.5 ML IM SUSY
0.5000 mL | PREFILLED_SYRINGE | Freq: Once | INTRAMUSCULAR | 0 refills | Status: AC
  Filled 2024-02-19: qty 0.5, 1d supply, fill #0

## 2024-02-20 DIAGNOSIS — H40003 Preglaucoma, unspecified, bilateral: Secondary | ICD-10-CM | POA: Diagnosis not present

## 2024-02-20 DIAGNOSIS — H527 Unspecified disorder of refraction: Secondary | ICD-10-CM | POA: Diagnosis not present

## 2024-02-22 ENCOUNTER — Ambulatory Visit (HOSPITAL_COMMUNITY): Payer: Self-pay

## 2024-02-22 ENCOUNTER — Ambulatory Visit (HOSPITAL_COMMUNITY)
Admission: RE | Admit: 2024-02-22 | Discharge: 2024-02-22 | Disposition: A | Discharge status: Home / Self Care | Attending: Gastroenterology | Admitting: Gastroenterology

## 2024-02-22 ENCOUNTER — Encounter (HOSPITAL_COMMUNITY)
Admission: RE | Disposition: A | Discharge status: Home / Self Care | Payer: Self-pay | Source: Home / Self Care | Attending: Gastroenterology

## 2024-02-22 DIAGNOSIS — Z1211 Encounter for screening for malignant neoplasm of colon: Secondary | ICD-10-CM | POA: Insufficient documentation

## 2024-02-22 DIAGNOSIS — K5730 Diverticulosis of large intestine without perforation or abscess without bleeding: Secondary | ICD-10-CM | POA: Insufficient documentation

## 2024-02-22 DIAGNOSIS — K635 Polyp of colon: Secondary | ICD-10-CM | POA: Diagnosis not present

## 2024-02-22 DIAGNOSIS — Z139 Encounter for screening, unspecified: Secondary | ICD-10-CM | POA: Diagnosis not present

## 2024-02-22 DIAGNOSIS — Z79899 Other long term (current) drug therapy: Secondary | ICD-10-CM | POA: Diagnosis not present

## 2024-02-22 DIAGNOSIS — K648 Other hemorrhoids: Secondary | ICD-10-CM | POA: Insufficient documentation

## 2024-02-22 DIAGNOSIS — D122 Benign neoplasm of ascending colon: Secondary | ICD-10-CM | POA: Diagnosis not present

## 2024-02-22 DIAGNOSIS — I10 Essential (primary) hypertension: Secondary | ICD-10-CM | POA: Diagnosis not present

## 2024-02-22 HISTORY — PX: COLONOSCOPY: SHX5424

## 2024-02-22 SURGERY — COLONOSCOPY
Anesthesia: General

## 2024-02-22 MED ORDER — PROPOFOL 10 MG/ML IV BOLUS
INTRAVENOUS | Status: DC | PRN
  Administered 2024-02-22: 100 mg via INTRAVENOUS

## 2024-02-22 MED ORDER — LACTATED RINGERS IV SOLN: INTRAVENOUS | Status: DC

## 2024-02-22 MED ORDER — PROPOFOL 500 MG/50ML IV EMUL
INTRAVENOUS | Status: DC | PRN
  Administered 2024-02-22: 200 ug/kg/min via INTRAVENOUS

## 2024-02-22 MED ADMIN — henylephrine-NaCl Pref Syr 0.8 MG/10ML-0.9% (80 MCG/ML): 160 ug | INTRAVENOUS | NDC 70092148646

## 2024-02-22 NOTE — Anesthesia Preprocedure Evaluation (Signed)

## 2024-02-22 NOTE — H&P (Signed)
 Karen Parker is an 53 y.o. female.   Chief Complaint: CRC screening HPI: 53 y/o F with PMh HTN, coming for screening colonoscopy.  States her last colonoscopy was 25+ years ago.  The patient denies having any complaints such as melena, hematochezia, abdominal pain or distention, change in her bowel movement consistency or frequency, no changes in weight recently.  No family history of colorectal cancer.   Past Medical History:  Diagnosis Date   Hypertension     Past Surgical History:  Procedure Laterality Date   ABLATION  2018   KNEE SURGERY Right 10/1993    Family History  Problem Relation Age of Onset   Diabetes Mother    Hypertension Father    Aortic dissection Father    CAD Maternal Grandmother    Social History:  reports that she has never smoked. She has never used smokeless tobacco. She reports that she does not currently use alcohol . She reports that she does not use drugs.  Allergies: No Known Allergies  Medications Prior to Admission  Medication Sig Dispense Refill   losartan -hydrochlorothiazide  (HYZAAR ) 50-12.5 MG tablet Take 1 tablet by mouth daily. 90 tablet 3   traZODone  (DESYREL ) 100 MG tablet Take 1 tablet (100 mg total) by mouth every evening. 90 tablet 3   Vitamin D -Vitamin K (D3 + K2 PO) Take by mouth. D3 + K2 5000 international unit - 100 mcg every day     EPINEPHrine  0.3 mg/0.3 mL IJ SOAJ injection Inject 0.3 mg into the muscle as needed for anaphylaxis. 2 each 1    No results found for this or any previous visit (from the past 48 hours). No results found.  Review of Systems  All other systems reviewed and are negative.   Blood pressure 109/66, pulse 73, temperature 98.4 F (36.9 C), temperature source Oral, resp. rate 12, height 5' 7 (1.702 m), weight 95.3 kg, SpO2 97%. Physical Exam  GENERAL: The patient is AO x3, in no acute distress. HEENT: Head is normocephalic and atraumatic. EOMI are intact. Mouth is well hydrated and without  lesions. NECK: Supple. No masses LUNGS: Clear to auscultation. No presence of rhonchi/wheezing/rales. Adequate chest expansion HEART: RRR, normal s1 and s2. ABDOMEN: Soft, nontender, no guarding, no peritoneal signs, and nondistended. BS +. No masses. EXTREMITIES: Without any cyanosis, clubbing, rash, lesions or edema. NEUROLOGIC: AOx3, no focal motor deficit. SKIN: no jaundice, no rashes  Assessment/Plan  53 y/o F with PMh HTN, coming for screening colonoscopy.  Will proceed with colonoscopy.  Toribio Eartha Flavors, MD 02/22/2024, 7:37 AM

## 2024-02-22 NOTE — Anesthesia Postprocedure Evaluation (Signed)
 Anesthesia Post Note  Patient: Karen Parker  Procedure(s) Performed: COLONOSCOPY  Patient location during evaluation: Phase II Anesthesia Type: General Level of consciousness: awake Pain management: pain level controlled Vital Signs Assessment: post-procedure vital signs reviewed and stable Respiratory status: spontaneous breathing and respiratory function stable Cardiovascular status: blood pressure returned to baseline and stable Postop Assessment: no headache and no apparent nausea or vomiting Anesthetic complications: no Comments: Late entry   No notable events documented.   Last Vitals:  Vitals:   02/22/24 0656 02/22/24 0816  BP: 109/66 92/61  Pulse: 73 70  Resp: 12 17  Temp: 36.9 C 36.6 C  SpO2: 97% 96%    Last Pain:  Vitals:   02/22/24 0816  TempSrc: Oral  PainSc: 0-No pain                 Yvonna JINNY Bosworth

## 2024-02-22 NOTE — Op Note (Signed)
 [facility] Patient Name: Karen Parker Procedure Date: 02/22/2024 7:10 AM MRN: [id number] Date of Birth: 09/18/1970 Attending MD: Toribio Fortune , , [id number] CSN: [id number] Age: 53 Admit Type: Outpatient Procedure:                Colonoscopy Indications:              Screening for colorectal malignant neoplasm Providers:                Toribio Fortune, Rosina Sprague, Madelin Hunter, RN Referring MD:              Medicines:                Monitored Anesthesia Care Complications:            No immediate complications. Estimated Blood Loss:     Estimated blood loss: none. Procedure:                Pre-Anesthesia Assessment:                           - Prior to the procedure, a History and Physical                            was performed, and patient medications, allergies                            and sensitivities were reviewed. The patient's                            tolerance of previous anesthesia was reviewed.                           - The risks and benefits of the procedure and the                            sedation options and risks were discussed with the                            patient. All questions were answered and informed                            consent was obtained.                           - ASA Grade Assessment: II - A patient with mild                            systemic disease.                           After obtaining informed consent, the colonoscope                            was passed under direct vision. Throughout the                            procedure, the patient's blood  pressure, pulse, and                            oxygen saturations were monitored continuously. The                            [id number] ([id number]) Peds Colon was introduced                            through the anus and advanced to the the cecum,                            identified by appendiceal orifice and ileocecal                            valve. The  colonoscopy was performed without                            difficulty. The patient tolerated the procedure                            well. The quality of the bowel preparation was good. Scope In: 7:54:50 AM Scope Out: 8:10:51 AM Scope Withdrawal Time: 0 hours 12 minutes 21 seconds  Total Procedure Duration: 0 hours 16 minutes 1 second  Findings:      The perianal and digital rectal examinations were normal.      Two sessile polyps were found in the ascending colon. The polyps were 4       to 5 mm in size. These polyps were removed with a cold snare. Resection       and retrieval were complete.      Multiple small-mouthed diverticula were found in the sigmoid colon and       descending colon.      Non-bleeding internal hemorrhoids were found during retroflexion. The       hemorrhoids were small. Impression:               - Two 4 to 5 mm polyps in the ascending colon,                            removed with a cold snare. Resected and retrieved.                           - Diverticulosis in the sigmoid colon and in the                            descending colon.                           - Non-bleeding internal hemorrhoids. Moderate Sedation:      Per Anesthesia Care Recommendation:           - Discharge patient to home (ambulatory).                           - Resume previous diet.                           -  Await pathology results.                           - Repeat colonoscopy for surveillance based on                            pathology results. Procedure Code(s):        --- Professional ---                           [phone], RT, Colonoscopy, flexible; with removal of                            tumor(s), polyp(s), or other lesion(s) by snare                            technique Diagnosis Code(s):        --- Professional ---                           Z12.11, Encounter for screening for malignant                            neoplasm of colon                           D12.2,  Benign neoplasm of ascending colon                           K64.8, Other hemorrhoids                           K57.30, Diverticulosis of large intestine without                            perforation or abscess without bleeding CPT copyright 2022 American [employer] Association. All rights reserved. The codes documented in this report are preliminary and upon coder review may  be revised to meet current compliance requirements. Toribio Fortune, MD Toribio Fortune,  02/22/2024 8:20:35 AM This report has been signed electronically. Number of Addenda: 0

## 2024-02-22 NOTE — Discharge Instructions (Signed)
 You are being discharged to home.  Resume your previous diet.  We are waiting for your pathology results.  Your physician has recommended a repeat colonoscopy for surveillance based on pathology results.

## 2024-02-22 NOTE — Transfer of Care (Signed)
 Immediate Anesthesia Transfer of Care Note  Patient: [name]  Procedure(s) Performed: COLONOSCOPY  Patient Location: Endoscopy Unit  Anesthesia Type:General  Level of Consciousness: awake, alert , oriented, and patient cooperative  Airway & Oxygen Therapy: Patient Spontanous Breathing  Post-op Assessment: Report given to RN, Post -op Vital signs reviewed and stable, and Patient moving all extremities X 4  Post vital signs: Reviewed and stable  Last Vitals:  Vitals Value Taken Time  BP 92/61 02/22/24 08:16  Temp 36.6 C 02/22/24 08:16  Pulse 70 02/22/24 08:16  Resp 17 02/22/24 08:16  SpO2 96 % 02/22/24 08:16    Last Pain:  Vitals:   02/22/24 0816  TempSrc: Oral  PainSc: 0-No pain      Patients Stated Pain Goal: 5 (02/22/24 0656)  Complications: No notable events documented.

## 2024-02-25 ENCOUNTER — Ambulatory Visit (INDEPENDENT_AMBULATORY_CARE_PROVIDER_SITE_OTHER): Payer: Self-pay | Admitting: Gastroenterology

## 2024-02-25 LAB — SURGICAL PATHOLOGY

## 2024-02-26 ENCOUNTER — Encounter (HOSPITAL_COMMUNITY): Payer: Self-pay | Admitting: Gastroenterology

## 2024-02-27 NOTE — Progress Notes (Signed)
 5 yr TCS noted in recall Patient result letter mailed procedure note and pathology result faxed to PCP

## 2024-04-11 ENCOUNTER — Other Ambulatory Visit (HOSPITAL_COMMUNITY): Payer: Self-pay
